# Patient Record
Sex: Female | Born: 1960 | Race: Black or African American | Hispanic: No | Smoking: Never smoker
Health system: Southern US, Community
[De-identification: ages and names within clinical notes are randomized; demographics above are authoritative.]

## PROBLEM LIST (undated history)

## (undated) DIAGNOSIS — I639 Cerebral infarction, unspecified: Secondary | ICD-10-CM

## (undated) DIAGNOSIS — E119 Type 2 diabetes mellitus without complications: Secondary | ICD-10-CM

## (undated) DIAGNOSIS — R4701 Aphasia: Secondary | ICD-10-CM

---

## 2020-04-30 ENCOUNTER — Other Ambulatory Visit: Payer: Self-pay

## 2020-04-30 ENCOUNTER — Encounter (HOSPITAL_COMMUNITY): Payer: Self-pay

## 2020-04-30 ENCOUNTER — Emergency Department (HOSPITAL_COMMUNITY)
Admission: EM | Admit: 2020-04-30 | Discharge: 2020-05-01 | Disposition: A | Discharge status: Skilled Nursing Facility | Payer: Medicare HMO | Attending: Emergency Medicine | Admitting: Emergency Medicine

## 2020-04-30 ENCOUNTER — Emergency Department (HOSPITAL_COMMUNITY): Payer: Medicare HMO

## 2020-04-30 DIAGNOSIS — Y9389 Activity, other specified: Secondary | ICD-10-CM | POA: Diagnosis not present

## 2020-04-30 DIAGNOSIS — W19XXXA Unspecified fall, initial encounter: Secondary | ICD-10-CM | POA: Insufficient documentation

## 2020-04-30 DIAGNOSIS — Y92128 Other place in nursing home as the place of occurrence of the external cause: Secondary | ICD-10-CM | POA: Diagnosis not present

## 2020-04-30 DIAGNOSIS — Y999 Unspecified external cause status: Secondary | ICD-10-CM | POA: Insufficient documentation

## 2020-04-30 DIAGNOSIS — S2231XA Fracture of one rib, right side, initial encounter for closed fracture: Secondary | ICD-10-CM | POA: Diagnosis not present

## 2020-04-30 DIAGNOSIS — S42001A Fracture of unspecified part of right clavicle, initial encounter for closed fracture: Secondary | ICD-10-CM | POA: Diagnosis not present

## 2020-04-30 DIAGNOSIS — E119 Type 2 diabetes mellitus without complications: Secondary | ICD-10-CM | POA: Insufficient documentation

## 2020-04-30 DIAGNOSIS — S299XXA Unspecified injury of thorax, initial encounter: Secondary | ICD-10-CM | POA: Diagnosis present

## 2020-04-30 HISTORY — DX: Type 2 diabetes mellitus without complications: E11.9

## 2020-04-30 HISTORY — DX: Aphasia: R47.01

## 2020-04-30 HISTORY — DX: Cerebral infarction, unspecified: I63.9

## 2020-04-30 MED ORDER — ACETAMINOPHEN 325 MG PO TABS
650.0000 mg | ORAL_TABLET | Freq: Once | ORAL | Status: DC
  Filled 2020-04-30: qty 2

## 2020-04-30 NOTE — ED Provider Notes (Addendum)
[facility] DEPT Provider Note   CSN: [id number] Arrival date & time: 04/30/20  2130     History Chief Complaint  Patient presents with  . Fall    Stacy Brandt is a 59 y.o. female.  The history is provided by the patient.  Fall This is a new problem. The current episode started yesterday. The problem has not changed since onset.Pertinent negatives include no chest pain, no abdominal pain, no headaches and no shortness of breath. Associated symptoms comments: Right clavicle fx on xray yesterday,. Here for possible ortho consult. . Nothing aggravates the symptoms. Nothing relieves the symptoms. She has tried nothing for the symptoms. The treatment provided no relief.       Past Medical History:  Diagnosis Date  . Aphasia   . Diabetes mellitus without complication ([place])   . Stroke [facility])     There are no problems to display for this patient.   History reviewed. No pertinent surgical history.   OB History   No obstetric history on file.     No family history on file.  Social History   Tobacco Use  . Smoking status: Not on file  Substance Use Topics  . Alcohol use: Not on file  . Drug use: Not on file    Home Medications Prior to Admission medications   Not on File    Allergies    Patient has no known allergies.  Review of Systems   Review of Systems  Constitutional: Negative for chills and fever.  HENT: Negative for ear pain and sore throat.   Eyes: Negative for pain and visual disturbance.  Respiratory: Negative for cough and shortness of breath.   Cardiovascular: Negative for chest pain and palpitations.  Gastrointestinal: Negative for abdominal pain and vomiting.  Genitourinary: Negative for dysuria and hematuria.  Musculoskeletal: Positive for arthralgias. Negative for back pain.  Skin: Negative for color change and rash.  Neurological: Negative for seizures, syncope and headaches.  All other systems reviewed and  are negative.   Physical Exam Updated Vital Signs BP 102/63 (BP Location: Left Arm)   Pulse 98   Temp 99 F (37.2 C) (Oral)   Resp 18   Ht 5\' 4"  (1.626 m)   Wt 50.8 kg   SpO2 99%   BMI 19.22 kg/m   Physical Exam Vitals and nursing note reviewed.  Constitutional:      General: She is not in acute distress.    Appearance: She is well-developed.  HENT:     Head: Normocephalic and atraumatic.     Mouth/Throat:     Mouth: Mucous membranes are moist.  Eyes:     Extraocular Movements: Extraocular movements intact.     Conjunctiva/sclera: Conjunctivae normal.     Pupils: Pupils are equal, round, and reactive to light.  Cardiovascular:     Rate and Rhythm: Normal rate and regular rhythm.     Pulses: Normal pulses.     Heart sounds: No murmur.  Pulmonary:     Effort: Pulmonary effort is normal. No respiratory distress.     Breath sounds: Normal breath sounds.  Abdominal:     Palpations: Abdomen is soft.     Tenderness: There is no abdominal tenderness.  Musculoskeletal:        General: Tenderness (TTP right clavicle) present. No swelling. Normal range of motion.     Cervical back: Normal range of motion and neck supple. No tenderness.  Skin:    General: Skin is warm and  dry.     Capillary Refill: Capillary refill takes less than 2 seconds.  Neurological:     General: No focal deficit present.     Mental Status: She is alert and oriented to person, place, and time.     Cranial Nerves: No cranial nerve deficit.     Sensory: No sensory deficit.     Coordination: Coordination normal.     Comments: 5+ out of 5 strength in the right upper extremity, normal sensation     ED Results / Procedures / Treatments   Labs (all labs ordered are listed, but only abnormal results are displayed) Labs Reviewed - No data to display  EKG None  Radiology DG Chest 2 View  Result Date: 04/30/2020 CLINICAL DATA:  Pain, fall EXAM: CHEST - 2 VIEW COMPARISON:  None. FINDINGS: Multiple skin  fold artifact over the left apex. No acute consolidation or pleural effusion. Cardiomediastinal silhouette within normal limits. Aortic atherosclerosis. Old appearing right rib fractures. Ununited fracture deformity of the mid right clavicle, not much appreciable overlying soft tissue swelling suggesting remote injury. IMPRESSION: No active cardiopulmonary disease. Electronically Signed   By: Donavan Foil M.D.   On: 04/30/2020 22:09   DG Clavicle Right  Result Date: 04/30/2020 CLINICAL DATA:  Fall in nursing home EXAM: RIGHT CLAVICLE - 2+ VIEWS COMPARISON:  None. FINDINGS: Suspected deformity of the middle third clavicle is likely chronic though some possible cortical step-off on the under surface may suggest an acute on chronic injury. There is a fracture of the posterior right second rib. No other acute osseous abnormality. No visible apical pneumothorax or other traumatic findings in the chest. IMPRESSION: 1. Fracture of the posterior right second rib. 2. Remote appearing deformity of the middle third clavicle with some questionable cortical step-off which could reflect an acute on chronic injury. Could correlate for point tenderness. Electronically Signed   By: Lovena Le M.D.   On: 04/30/2020 22:08    Procedures Procedures (including critical care time)  Medications Ordered in ED Medications  acetaminophen (TYLENOL) tablet 650 mg (has no administration in time range)    ED Course  I have reviewed the triage vital signs and the nursing notes.  Pertinent labs & imaging results that were available during my care of the patient were reviewed by me and considered in my medical decision making (see chart for details).    MDM Rules/Calculators/A&P                      Stacy Brandt is a 59 year old female with history of stroke, diabetes who presents to the ED with right clavicle pain.  Was diagnosed with fracture yesterday.  Was sent for orthopedic consultation from her skilled nursing  facility.  Patient with normal vitals.  No fever.  Pain in the right clavicular area.  Otherwise no pain.  Today shows fracture of the posterior right second rib and likely fracture of the right clavicle.  there is no pneumothorax.  Patient is overall well-appearing.  Neurovascularly intact.  Placed in a sling for comfort.  These are nonoperative injuries.  Given number to follow-up with orthopedics outpatient which is appropriate.  Discharged in good condition.  Given Tylenol.  Recommend Tylenol Motrin for pain.  Patient overall comfortable not in much pain on exam.  This chart was dictated using voice recognition software.  Despite best efforts to proofread,  errors can occur which can change the documentation meaning.   Final Clinical Impression(s) / ED Diagnoses  Final diagnoses:  Closed displaced fracture of right clavicle, unspecified part of clavicle, initial encounter    Rx / DC Orders ED Discharge Orders    None       Lennice Sites, DO 04/30/20 2217    Lennice Sites, DO 04/30/20 2218

## 2020-04-30 NOTE — Discharge Instructions (Signed)
You likely have acute on chronic clavicle fracture, second rib fracture.  These are likely nonoperative.  Wear sling for comfort.  Tylenol Motrin for pain as needed.  I have given you the number to follow-up with orthopedics.

## 2020-04-30 NOTE — ED Triage Notes (Signed)
Fall yesterday from [facility]. XR resulted today and showed a right mid clavicle fx per facility. Sent for ortho consult.

## 2020-05-01 ENCOUNTER — Encounter (HOSPITAL_COMMUNITY): Payer: Self-pay | Admitting: Emergency Medicine

## 2020-05-01 DIAGNOSIS — S42001A Fracture of unspecified part of right clavicle, initial encounter for closed fracture: Secondary | ICD-10-CM | POA: Diagnosis not present

## 2020-05-01 NOTE — Progress Notes (Signed)
Orthopedic Tech Progress Note Patient Details:  Stacy Brandt Dec 16, 1961 [id number]  Ortho Devices Type of Ortho Device: Sling immobilizer Ortho Device/Splint Location: Charging for ER supplies       [name] 05/01/2020, 7:27 AM

## 2020-07-27 ENCOUNTER — Other Ambulatory Visit: Payer: Self-pay

## 2020-07-27 ENCOUNTER — Encounter (HOSPITAL_COMMUNITY): Payer: Self-pay

## 2020-07-27 ENCOUNTER — Emergency Department (HOSPITAL_COMMUNITY)
Admission: EM | Admit: 2020-07-27 | Discharge: 2020-07-28 | Disposition: A | Discharge status: Home / Self Care | Payer: Medicare HMO | Attending: Emergency Medicine | Admitting: Emergency Medicine

## 2020-07-27 ENCOUNTER — Emergency Department (HOSPITAL_COMMUNITY): Payer: Medicare HMO

## 2020-07-27 DIAGNOSIS — R1084 Generalized abdominal pain: Secondary | ICD-10-CM | POA: Diagnosis not present

## 2020-07-27 DIAGNOSIS — Z794 Long term (current) use of insulin: Secondary | ICD-10-CM | POA: Diagnosis not present

## 2020-07-27 DIAGNOSIS — Z7982 Long term (current) use of aspirin: Secondary | ICD-10-CM | POA: Diagnosis not present

## 2020-07-27 DIAGNOSIS — Z20822 Contact with and (suspected) exposure to covid-19: Secondary | ICD-10-CM | POA: Insufficient documentation

## 2020-07-27 DIAGNOSIS — Z79899 Other long term (current) drug therapy: Secondary | ICD-10-CM | POA: Insufficient documentation

## 2020-07-27 DIAGNOSIS — E119 Type 2 diabetes mellitus without complications: Secondary | ICD-10-CM | POA: Insufficient documentation

## 2020-07-27 DIAGNOSIS — Z8673 Personal history of transient ischemic attack (TIA), and cerebral infarction without residual deficits: Secondary | ICD-10-CM | POA: Insufficient documentation

## 2020-07-27 DIAGNOSIS — N390 Urinary tract infection, site not specified: Secondary | ICD-10-CM

## 2020-07-27 DIAGNOSIS — R112 Nausea with vomiting, unspecified: Secondary | ICD-10-CM | POA: Diagnosis not present

## 2020-07-27 DIAGNOSIS — K5900 Constipation, unspecified: Secondary | ICD-10-CM

## 2020-07-27 DIAGNOSIS — N7011 Chronic salpingitis: Secondary | ICD-10-CM

## 2020-07-27 DIAGNOSIS — R339 Retention of urine, unspecified: Secondary | ICD-10-CM

## 2020-07-27 LAB — COMPREHENSIVE METABOLIC PANEL
ALT: 14 U/L (ref 0–44)
AST: 30 U/L (ref 15–41)
Albumin: 3.7 g/dL (ref 3.5–5.0)
Alkaline Phosphatase: 52 U/L (ref 38–126)
Anion gap: 12 (ref 5–15)
BUN: 34 mg/dL — ABNORMAL HIGH (ref 6–20)
CO2: 20 mmol/L — ABNORMAL LOW (ref 22–32)
Calcium: 9 mg/dL (ref 8.9–10.3)
Chloride: 108 mmol/L (ref 98–111)
Creatinine, Ser: 1.05 mg/dL — ABNORMAL HIGH (ref 0.44–1.00)
GFR calc Af Amer: >60 mL/min (ref >60)
GFR calc non Af Amer: 59 mL/min — ABNORMAL LOW (ref >60)
Glucose, Bld: 315 mg/dL — ABNORMAL HIGH (ref 70–99)
Potassium: 5.8 mmol/L — ABNORMAL HIGH (ref 3.5–5.1)
Sodium: 140 mmol/L (ref 135–145)
Total Bilirubin: 1.3 mg/dL — ABNORMAL HIGH (ref 0.3–1.2)
Total Protein: 7.4 g/dL (ref 6.5–8.1)

## 2020-07-27 LAB — CBC WITH DIFFERENTIAL/PLATELET
Abs Immature Granulocytes: 0.04 10*3/uL (ref 0.00–0.07)
Basophils Absolute: 0 10*3/uL (ref 0.0–0.1)
Basophils Relative: 0 %
Eosinophils Absolute: 0 10*3/uL (ref 0.0–0.5)
Eosinophils Relative: 0 %
HCT: 32.5 % — ABNORMAL LOW (ref 36.0–46.0)
Hemoglobin: 9.9 g/dL — ABNORMAL LOW (ref 12.0–15.0)
Immature Granulocytes: 0 %
Lymphocytes Relative: 12 %
Lymphs Abs: 1.3 10*3/uL (ref 0.7–4.0)
MCH: 28.2 pg (ref 26.0–34.0)
MCHC: 30.5 g/dL (ref 30.0–36.0)
MCV: 92.6 fL (ref 80.0–100.0)
Monocytes Absolute: 0.3 10*3/uL (ref 0.1–1.0)
Monocytes Relative: 3 %
Neutro Abs: 9.3 10*3/uL — ABNORMAL HIGH (ref 1.7–7.7)
Neutrophils Relative %: 85 %
Platelets: 212 10*3/uL (ref 150–400)
RBC: 3.51 MIL/uL — ABNORMAL LOW (ref 3.87–5.11)
RDW: 13.4 % (ref 11.5–15.5)
WBC: 11 10*3/uL — ABNORMAL HIGH (ref 4.0–10.5)
nRBC: 0 % (ref 0.0–0.2)

## 2020-07-27 LAB — SARS CORONAVIRUS 2 BY RT PCR (HOSPITAL ORDER, PERFORMED IN ~~LOC~~ HOSPITAL LAB): SARS Coronavirus 2: NEGATIVE

## 2020-07-27 LAB — LIPASE, BLOOD: Lipase: 30 U/L (ref 11–51)

## 2020-07-27 MED ORDER — MORPHINE SULFATE (PF) 4 MG/ML IV SOLN
4.0000 mg | Freq: Once | INTRAVENOUS | Status: AC
  Administered 2020-07-27: 4 mg via INTRAVENOUS
  Filled 2020-07-27: qty 1

## 2020-07-27 MED ORDER — INSULIN ASPART 100 UNIT/ML IV SOLN
10.0000 [IU] | Freq: Once | INTRAVENOUS | Status: AC
  Administered 2020-07-27: 10 [IU] via INTRAVENOUS
  Filled 2020-07-27: qty 0.1

## 2020-07-27 MED ORDER — ALUM & MAG HYDROXIDE-SIMETH 200-200-20 MG/5ML PO SUSP
30.0000 mL | Freq: Once | ORAL | Status: AC
  Administered 2020-07-27: 30 mL via ORAL
  Filled 2020-07-27: qty 30

## 2020-07-27 MED ORDER — PROCHLORPERAZINE EDISYLATE 10 MG/2ML IJ SOLN
10.0000 mg | Freq: Once | INTRAMUSCULAR | Status: AC
  Administered 2020-07-27: 10 mg via INTRAVENOUS
  Filled 2020-07-27: qty 2

## 2020-07-27 MED ORDER — SODIUM CHLORIDE 0.9 % IV BOLUS
1000.0000 mL | Freq: Once | INTRAVENOUS | Status: AC
  Administered 2020-07-27: 1000 mL via INTRAVENOUS

## 2020-07-27 MED ORDER — IOHEXOL 300 MG/ML  SOLN
100.0000 mL | Freq: Once | INTRAMUSCULAR | Status: AC | PRN
  Administered 2020-07-27: 100 mL via INTRAVENOUS

## 2020-07-27 MED ORDER — DIPHENHYDRAMINE HCL 50 MG/ML IJ SOLN
25.0000 mg | Freq: Once | INTRAMUSCULAR | Status: AC
  Administered 2020-07-27: 25 mg via INTRAVENOUS
  Filled 2020-07-27: qty 1

## 2020-07-27 MED ORDER — SODIUM CHLORIDE (PF) 0.9 % IJ SOLN
INTRAMUSCULAR | Status: AC
  Filled 2020-07-27: qty 50

## 2020-07-27 NOTE — ED Triage Notes (Signed)
Patient arrived via [facility] with complaints of abdominal pain, NV, fever, malaise. Patient has been vaccinated and there is a Covid-19 positive patient in her facility. 4mg  zofran given en route. PO tylenol and zofran given at facility at 1600

## 2020-07-27 NOTE — ED Notes (Signed)
Recollect light green tube sent to the lab
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
## 2020-07-27 NOTE — ED Notes (Signed)
Patient tolerating fluids with no difficulty

## 2020-07-28 DIAGNOSIS — R1084 Generalized abdominal pain: Secondary | ICD-10-CM | POA: Diagnosis not present

## 2020-07-28 LAB — I-STAT CHEM 8, ED
BUN: 30 mg/dL — ABNORMAL HIGH (ref 6–20)
Calcium, Ion: 1.14 mmol/L — ABNORMAL LOW (ref 1.15–1.40)
Chloride: 112 mmol/L — ABNORMAL HIGH (ref 98–111)
Creatinine, Ser: 0.6 mg/dL (ref 0.44–1.00)
Glucose, Bld: 194 mg/dL — ABNORMAL HIGH (ref 70–99)
HCT: 24 % — ABNORMAL LOW (ref 36.0–46.0)
Hemoglobin: 8.2 g/dL — ABNORMAL LOW (ref 12.0–15.0)
Potassium: 3.4 mmol/L — ABNORMAL LOW (ref 3.5–5.1)
Sodium: 146 mmol/L — ABNORMAL HIGH (ref 135–145)
TCO2: 20 mmol/L — ABNORMAL LOW (ref 22–32)

## 2020-07-28 LAB — URINALYSIS, ROUTINE W REFLEX MICROSCOPIC
Bacteria, UA: NONE SEEN
Bilirubin Urine: NEGATIVE
Glucose, UA: 150 mg/dL — AB
Ketones, ur: NEGATIVE mg/dL
Nitrite: NEGATIVE
Protein, ur: 100 mg/dL — AB
Specific Gravity, Urine: 1.021 (ref 1.005–1.030)
WBC, UA: >50 WBC/hpf — ABNORMAL HIGH (ref 0–5)
pH: 5 (ref 5.0–8.0)

## 2020-07-28 MED ORDER — CEPHALEXIN 500 MG PO CAPS: 500.0000 mg | ORAL_CAPSULE | Freq: Three times a day (TID) | ORAL | 0 refills | Status: AC

## 2020-07-28 MED ORDER — ONDANSETRON 4 MG PO TBDP
ORAL_TABLET | ORAL | 0 refills | Status: DC
Start: 2020-07-28 — End: 2020-08-10

## 2020-07-28 MED ORDER — CEPHALEXIN 500 MG PO CAPS
500.0000 mg | ORAL_CAPSULE | Freq: Once | ORAL | Status: AC
  Administered 2020-07-28: 500 mg via ORAL
  Filled 2020-07-28: qty 1

## 2020-07-28 MED ORDER — FLUCONAZOLE 150 MG PO TABS
150.0000 mg | ORAL_TABLET | Freq: Once | ORAL | Status: AC
  Administered 2020-07-28: 150 mg via ORAL
  Filled 2020-07-28: qty 1

## 2020-07-28 MED ORDER — POLYETHYLENE GLYCOL 3350 17 G PO PACK: 17.0000 g | PACK | Freq: Every day | ORAL | 0 refills | Status: AC

## 2020-07-28 NOTE — ED Notes (Signed)
[facility] called for transport.  

## 2020-07-28 NOTE — Discharge Instructions (Addendum)
You are in urinary retention.  Please keep catheter in place until you see urology in 2 to 3 days for removal.  You may have a urine infection as well so please take Keflex 500 mg 3 times daily for 5 days.  You are constipated as well so take MiraLAX daily for a week.  You have hydrosalpinx on your CT so please follow-up with women's outpatient clinic for ultrasound  Take Zofran for nausea and vomiting.  See your doctor for follow-up.  Return to ER if you have severe abdominal pain, vomiting, fever.

## 2020-07-28 NOTE — ED Provider Notes (Signed)
  Physical Exam  BP (!) 124/63   Pulse (!) 106   Temp 98.5 F (36.9 C) (Oral)   Resp 13   SpO2 94%   Physical Exam  ED Course/Procedures     Procedures  MDM  Care assumed at 11 pm.  Patient is presenting here for abdominal pain and vomiting.  Signout pending CT abdomen pelvis and Foley catheter since patient appears to be in retention.  12:40 AM Patient's catheter drained about 1 L.  UA showed leukocytes and some yeast.  Will give a course of Keflex and a dose of Diflucan.  Of note, patient's initial potassium is 5.8 but repeat is 3.4 and I think the initial 1 is likely hemolyzed.  Her CT showed constipation.  She also has some hydrosalpinx that can be evaluated with outpatient [place].      Drenda Freeze, MD 07/28/20 [id number]

## 2020-07-28 NOTE — ED Notes (Signed)
Attempted to call report to [facility] with no answer

## 2020-08-09 ENCOUNTER — Other Ambulatory Visit: Payer: Self-pay

## 2020-08-09 ENCOUNTER — Emergency Department (HOSPITAL_COMMUNITY)
Admission: EM | Admit: 2020-08-09 | Discharge: 2020-08-10 | Disposition: A | Discharge status: Home / Self Care | Payer: Medicare HMO | Attending: Emergency Medicine | Admitting: Emergency Medicine

## 2020-08-09 DIAGNOSIS — Z20822 Contact with and (suspected) exposure to covid-19: Secondary | ICD-10-CM | POA: Diagnosis not present

## 2020-08-09 DIAGNOSIS — E1165 Type 2 diabetes mellitus with hyperglycemia: Secondary | ICD-10-CM | POA: Diagnosis not present

## 2020-08-09 DIAGNOSIS — N7011 Chronic salpingitis: Secondary | ICD-10-CM | POA: Diagnosis not present

## 2020-08-09 DIAGNOSIS — Z794 Long term (current) use of insulin: Secondary | ICD-10-CM | POA: Insufficient documentation

## 2020-08-09 DIAGNOSIS — R112 Nausea with vomiting, unspecified: Secondary | ICD-10-CM | POA: Insufficient documentation

## 2020-08-09 DIAGNOSIS — Z7982 Long term (current) use of aspirin: Secondary | ICD-10-CM | POA: Insufficient documentation

## 2020-08-09 DIAGNOSIS — Z8673 Personal history of transient ischemic attack (TIA), and cerebral infarction without residual deficits: Secondary | ICD-10-CM | POA: Insufficient documentation

## 2020-08-09 DIAGNOSIS — R739 Hyperglycemia, unspecified: Secondary | ICD-10-CM | POA: Diagnosis present

## 2020-08-09 LAB — URINALYSIS, ROUTINE W REFLEX MICROSCOPIC
Bacteria, UA: NONE SEEN
Bilirubin Urine: NEGATIVE
Glucose, UA: >=500 mg/dL — AB
Ketones, ur: 5 mg/dL — AB
Leukocytes,Ua: NEGATIVE
Nitrite: NEGATIVE
Protein, ur: >=300 mg/dL — AB
Specific Gravity, Urine: 1.017 (ref 1.005–1.030)
pH: 5 (ref 5.0–8.0)

## 2020-08-09 LAB — COMPREHENSIVE METABOLIC PANEL
ALT: 25 U/L (ref 0–44)
AST: 14 U/L — ABNORMAL LOW (ref 15–41)
Albumin: 4.2 g/dL (ref 3.5–5.0)
Alkaline Phosphatase: 60 U/L (ref 38–126)
Anion gap: 15 (ref 5–15)
BUN: 35 mg/dL — ABNORMAL HIGH (ref 6–20)
CO2: 23 mmol/L (ref 22–32)
Calcium: 10.4 mg/dL — ABNORMAL HIGH (ref 8.9–10.3)
Chloride: 101 mmol/L (ref 98–111)
Creatinine, Ser: 1.05 mg/dL — ABNORMAL HIGH (ref 0.44–1.00)
GFR calc Af Amer: >60 mL/min (ref >60)
GFR calc non Af Amer: 59 mL/min — ABNORMAL LOW (ref >60)
Glucose, Bld: 416 mg/dL — ABNORMAL HIGH (ref 70–99)
Potassium: 4.2 mmol/L (ref 3.5–5.1)
Sodium: 139 mmol/L (ref 135–145)
Total Bilirubin: 0.6 mg/dL (ref 0.3–1.2)
Total Protein: 8.1 g/dL (ref 6.5–8.1)

## 2020-08-09 LAB — CBC WITH DIFFERENTIAL/PLATELET
Abs Immature Granulocytes: 0.06 10*3/uL (ref 0.00–0.07)
Basophils Absolute: 0 10*3/uL (ref 0.0–0.1)
Basophils Relative: 0 %
Eosinophils Absolute: 0 10*3/uL (ref 0.0–0.5)
Eosinophils Relative: 0 %
HCT: 36.1 % (ref 36.0–46.0)
Hemoglobin: 11.5 g/dL — ABNORMAL LOW (ref 12.0–15.0)
Immature Granulocytes: 0 %
Lymphocytes Relative: 11 %
Lymphs Abs: 1.5 10*3/uL (ref 0.7–4.0)
MCH: 28.3 pg (ref 26.0–34.0)
MCHC: 31.9 g/dL (ref 30.0–36.0)
MCV: 88.9 fL (ref 80.0–100.0)
Monocytes Absolute: 0.4 10*3/uL (ref 0.1–1.0)
Monocytes Relative: 3 %
Neutro Abs: 11.8 10*3/uL — ABNORMAL HIGH (ref 1.7–7.7)
Neutrophils Relative %: 86 %
Platelets: 315 10*3/uL (ref 150–400)
RBC: 4.06 MIL/uL (ref 3.87–5.11)
RDW: 12.5 % (ref 11.5–15.5)
WBC: 13.8 10*3/uL — ABNORMAL HIGH (ref 4.0–10.5)
nRBC: 0 % (ref 0.0–0.2)

## 2020-08-09 LAB — CBG MONITORING, ED: Glucose-Capillary: 395 mg/dL — ABNORMAL HIGH (ref 70–99)

## 2020-08-09 LAB — LIPASE, BLOOD: Lipase: 30 U/L (ref 11–51)

## 2020-08-09 MED ORDER — SODIUM CHLORIDE 0.9 % IV BOLUS
1000.0000 mL | Freq: Once | INTRAVENOUS | Status: AC
  Administered 2020-08-09: 1000 mL via INTRAVENOUS

## 2020-08-09 MED ORDER — FENTANYL CITRATE (PF) 100 MCG/2ML IJ SOLN
50.0000 ug | Freq: Once | INTRAMUSCULAR | Status: AC
  Administered 2020-08-09: 50 ug via INTRAVENOUS
  Filled 2020-08-09: qty 2

## 2020-08-09 MED ORDER — ONDANSETRON HCL 4 MG/2ML IJ SOLN
4.0000 mg | Freq: Once | INTRAMUSCULAR | Status: AC
  Administered 2020-08-09: 4 mg via INTRAVENOUS
  Filled 2020-08-09: qty 2

## 2020-08-09 NOTE — ED Provider Notes (Signed)
[facility] DEPT Provider Note   CSN: [id number] Arrival date & time: 08/09/20  2142     History Chief Complaint  Patient presents with   Hyperglycemia    Stacy Brandt is a 59 y.o. female with a h/o of DM Type II, CVA presents to the emergency department with a chief complaint of vomiting.  The patient reports nausea and vomiting, onset today.  She is unsure how many episodes of nonbloody, nonbilious vomiting that she had prior to arrival.  She is endorsing diffuse, associated abdominal pain.  States the pain began earlier today.  Pain has been constant.  No known aggravating or alleviating factors.  No diarrhea or constipation, fever, chills, chest pain, shortness of breath, cough, dysuria, hematuria, or vaginal discharge.  She has a Foley catheter in place from an ER visit 2 weeks ago.  EMS reports patient's CBG was 457 in route.  She was given fluids.  Nursing facility states that patient was given 20 units of Levemir tonight along with her other evening medications.  Spoke with [facility] staff; however, patient's nurse was unavailable.  Staff reports that patient called EMS without consulting them and was not evaluated by in-house staff prior to coming to the ER.   The history is provided by the patient. No language interpreter was used.       Past Medical History:  Diagnosis Date   Aphasia    Diabetes mellitus without complication ([place])    Stroke ([place])     There are no problems to display for this patient.   No past surgical history on file.   OB History   No obstetric history on file.     No family history on file.  Social History   Tobacco Use   Smoking status: Never Smoker   Smokeless tobacco: Never Used  Vaping Use   Vaping Use: Never used  Substance Use Topics   Alcohol use: Not Currently   Drug use: Not Currently    Home Medications Prior to Admission medications   Medication Sig Start Date End Date  Taking? Authorizing Provider  acetaminophen (TYLENOL) 325 MG tablet Take 650 mg by mouth every 6 (six) hours as needed (pain).    [provider]  albuterol (VENTOLIN HFA) 108 (90 Base) MCG/ACT inhaler Inhale 2 puffs into the lungs every 4 (four) hours as needed for wheezing or shortness of breath.    [provider]  amLODipine (NORVASC) 10 MG tablet Take 10 mg by mouth daily.    [provider]  aspirin EC 81 MG tablet Take 81 mg by mouth daily. Swallow whole.    [provider]  cephALEXin (KEFLEX) 500 MG capsule Take 1 capsule (500 mg total) by mouth 3 (three) times daily. 07/28/20   Drenda Freeze, MD  cholecalciferol (VITAMIN D3) 25 MCG (1000 UNIT) tablet Take 2,000 Units by mouth daily.    [provider]  dicyclomine (BENTYL) 20 MG tablet Take 20 mg by mouth in the morning and at bedtime.    [provider]  diphenhydrAMINE (BENADRYL) 25 MG tablet Take 25 mg by mouth 2 (two) times daily as needed (allergic reaction).    [provider]  FLUoxetine (PROZAC) 20 MG tablet Take 20 mg by mouth daily.    [provider]  gabapentin (NEURONTIN) 100 MG capsule Take 100 mg by mouth 3 (three) times daily.    [provider]  hydrochlorothiazide (HYDRODIURIL) 12.5 MG tablet Take 12.5 mg by mouth  daily.    [provider]  insulin aspart (NOVOLOG) 100 UNIT/ML injection Inject 3-15 Units into the skin See admin instructions. Inject 3-15 units subcutaneously three times daily before meals per sliding scale: CBG 101-150 3 units, 151-200 4 units, 201-250 7 units, 251-300 9 units, 301-350 12 units, >350 15 units, call MD for CBG >400 or <60    [provider]  insulin detemir (LEVEMIR FLEXTOUCH) 100 UNIT/ML FlexPen Inject 20 Units into the skin 2 (two) times daily.    [provider]  ketoconazole (NIZORAL) 2 % shampoo Apply 1 application topically every Wednesday.    [provider]   lisinopril (ZESTRIL) 40 MG tablet Take 20 mg by mouth daily.    [provider]  omeprazole (PRILOSEC) 20 MG capsule Take 20 mg by mouth 2 (two) times daily.    [provider]  ondansetron (ZOFRAN ODT) 4 MG disintegrating tablet Take 1 tablet (4 mg total) by mouth every 8 (eight) hours as needed. 08/10/20   Aneliese Beaudry A, PA-C  ondansetron (ZOFRAN) 4 MG tablet Take 4 mg by mouth every 8 (eight) hours as needed for nausea or vomiting.    [provider]  polyethylene glycol (MIRALAX / GLYCOLAX) 17 g packet Take 17 g by mouth daily. 07/28/20   Drenda Freeze, MD  rosuvastatin (CRESTOR) 40 MG tablet Take 40 mg by mouth daily at 6 PM.    [provider]  triamcinolone cream (KENALOG) 0.1 % Apply 1 application topically 2 (two) times daily as needed (allergic reaction).    [provider]    Allergies    Patient has no known allergies.  Review of Systems   Review of Systems  Constitutional: Negative for activity change, chills and fever.  HENT: Negative for congestion and sore throat.   Respiratory: Negative for cough and shortness of breath.   Cardiovascular: Negative for chest pain.  Gastrointestinal: Positive for abdominal pain, nausea and vomiting. Negative for constipation and diarrhea.  Genitourinary: Negative for dysuria, frequency, hematuria, urgency, vaginal bleeding, vaginal discharge and vaginal pain.  Musculoskeletal: Negative for arthralgias, back pain, myalgias and neck stiffness.  Skin: Negative for rash.  Allergic/Immunologic: Negative for immunocompromised state.  Neurological: Negative for seizures, syncope, weakness and headaches.  Psychiatric/Behavioral: Negative for confusion.    Physical Exam Updated Vital Signs BP 124/70    Pulse 97    Temp 98.4 F (36.9 C) (Oral)    Resp 18    SpO2 96%   Physical Exam Vitals and nursing note reviewed.  Constitutional:      General: She is not in acute distress.    Comments:  Nontoxic-appearing.  No acute distress. Foley catheter in place.  Light yellow urine noted in bag.  HENT:     Head: Normocephalic.  Eyes:     Conjunctiva/sclera: Conjunctivae normal.  Cardiovascular:     Rate and Rhythm: Normal rate and regular rhythm.     Heart sounds: No murmur heard.  No friction rub. No gallop.   Pulmonary:     Effort: Pulmonary effort is normal. No respiratory distress.     Breath sounds: No stridor. No wheezing, rhonchi or rales.  Chest:     Chest wall: No tenderness.  Abdominal:     General: There is no distension.     Palpations: Abdomen is soft. There is no mass.     Tenderness: There is no abdominal tenderness. There is no right CVA tenderness, left CVA tenderness, guarding or rebound.  Hernia: No hernia is present.     Comments: Multiple scars noted from previous abdominal surgeries.  No wound dehiscence.  Abdomen is soft, nontender, nondistended.  Normoactive bowel sounds.  No tenderness over McBurney's point.  Negative Murphy sign.  No CVA tenderness bilaterally.  Musculoskeletal:     Cervical back: Neck supple.     Right lower leg: No edema.     Left lower leg: No edema.  Skin:    General: Skin is warm.     Findings: No rash.  Neurological:     Mental Status: She is alert.  Psychiatric:        Behavior: Behavior normal.     ED Results / Procedures / Treatments   Labs (all labs ordered are listed, but only abnormal results are displayed) Labs Reviewed  CBC WITH DIFFERENTIAL/PLATELET - Abnormal; Notable for the following components:      Result Value   WBC 13.8 (*)    Hemoglobin 11.5 (*)    Neutro Abs 11.8 (*)    All other components within normal limits  COMPREHENSIVE METABOLIC PANEL - Abnormal; Notable for the following components:   Glucose, Bld 416 (*)    BUN 35 (*)    Creatinine, Ser 1.05 (*)    Calcium 10.4 (*)    AST 14 (*)    GFR calc non Af Amer 59 (*)    All other components within normal limits  URINALYSIS, ROUTINE W  REFLEX MICROSCOPIC - Abnormal; Notable for the following components:   Glucose, UA >=500 (*)    Hgb urine dipstick SMALL (*)    Ketones, ur 5 (*)    Protein, ur >=300 (*)    All other components within normal limits  CBG MONITORING, ED - Abnormal; Notable for the following components:   Glucose-Capillary 395 (*)    All other components within normal limits  CBG MONITORING, ED - Abnormal; Notable for the following components:   Glucose-Capillary 334 (*)    All other components within normal limits  CBG MONITORING, ED - Abnormal; Notable for the following components:   Glucose-Capillary 301 (*)    All other components within normal limits  SARS CORONAVIRUS 2 BY RT PCR (HOSPITAL ORDER, [facility])  LIPASE, BLOOD    EKG None  Radiology No results found.  Procedures Procedures (including critical care time)  Medications Ordered in ED Medications  fentaNYL (SUBLIMAZE) injection 50 mcg (50 mcg Intravenous Given 08/09/20 2303)  ondansetron (ZOFRAN) injection 4 mg (4 mg Intravenous Given 08/09/20 2300)  sodium chloride 0.9 % bolus 1,000 mL (0 mLs Intravenous Stopped 08/10/20 0251)  insulin aspart (novoLOG) injection 10 Units (10 Units Subcutaneous Given 08/10/20 0300)    ED Course  I have reviewed the triage vital signs and the nursing notes.  Pertinent labs & imaging results that were available during my care of the patient were reviewed by me and considered in my medical decision making (see chart for details).    MDM Rules/Calculators/A&P                          59 year old female with a h/o of DM Type II, CVA who presents to the emergency department by EMS from [facility] for vomiting.  Staff reports that the patient called EMS on her own before she was evaluated by in-house staff.  She presented with 1 day of vomiting and diffuse abdominal pain.  No vomiting noted since patient arrived in  the ER.  She was mildly tachycardic on arrival.  Afebrile.   Hypertensive on arrival, but normotensive by the time of discharge.  Labs notable for glucose of 416 on arrival with normal anion gap and bicarb.  Creatinine is 1, up from 0.62 weeks ago.  Although she appears to have a mild leukocytosis of 13, I suspect this is actually hemoconcentration from her previous CBC given the previous H&H were 8.2/24 --> 11.5/36 today.    She was given IV fluids, insulin, and Zofran.  She is successfully fluid challenge.  UA with worsening glucosuria.  She does have 3+ proteinuria.  However, she needs to follow-up with urology for Foley catheter removal.  There is no evidence of UTI.  Glucose has been downtrending since treatment and tachycardia resolved.  Abdomen is soft and benign.  Her previous CT scan from 2 weeks ago was reviewed.  She may have hydrosalpinx, and I will give her a referral to OB/GYN.  There was some concern for constipation.  However, today her exam is not concerning for bowel obstruction.  Patient does appear to have some concerns about returning to [facility], but she can follow-up with social work if she needs alternative placement.  I have a low suspicion for cholecystitis, appendicitis, diverticulitis, bowel obstruction, PID, pyelonephritis, or mesenteric ischemia.  She is hemodynamically stable and in no acute distress.  Safe for discharge home from outpatient follow-up as indicated.  Final Clinical Impression(s) / ED Diagnoses Final diagnoses:  Non-intractable vomiting with nausea, unspecified vomiting type  Hyperglycemia  Hydrosalpinx    Rx / DC Orders ED Discharge Orders         Ordered    ondansetron (ZOFRAN ODT) 4 MG disintegrating tablet  Every 8 hours PRN     Discontinue  Reprint     08/10/20 0308           Tumeka Chimenti A, PA-C 35/46/56 [id number]    Delora Fuel, MD 51/70/01 [id number]

## 2020-08-09 NOTE — ED Triage Notes (Signed)
Pt from [facility], pt called 911 stated she report abd pain with N/V during transport CBG 457 fluid started on arrival. Pt was given evening meds prior to going to ED routine dose levemir 20 units with other evening meds see MAR

## 2020-08-10 DIAGNOSIS — R112 Nausea with vomiting, unspecified: Secondary | ICD-10-CM | POA: Diagnosis not present

## 2020-08-10 LAB — SARS CORONAVIRUS 2 BY RT PCR (HOSPITAL ORDER, PERFORMED IN ~~LOC~~ HOSPITAL LAB): SARS Coronavirus 2: NEGATIVE

## 2020-08-10 LAB — CBG MONITORING, ED
Glucose-Capillary: 334 mg/dL — ABNORMAL HIGH (ref 70–99)
Glucose-Capillary: 301 mg/dL — ABNORMAL HIGH (ref 70–99)

## 2020-08-10 MED ORDER — INSULIN ASPART 100 UNIT/ML ~~LOC~~ SOLN
10.0000 [IU] | Freq: Once | SUBCUTANEOUS | Status: AC
  Administered 2020-08-10: 10 [IU] via SUBCUTANEOUS
  Filled 2020-08-10: qty 0.1

## 2020-08-10 MED ORDER — ONDANSETRON 4 MG PO TBDP: 4.0000 mg | ORAL_TABLET | Freq: Three times a day (TID) | ORAL | 0 refills | Status: AC | PRN

## 2020-08-10 NOTE — ED Notes (Signed)
[facility] called for transport. report called to Facility and spoke to Ms. Cindy Rich,RN

## 2020-08-10 NOTE — ED Notes (Signed)
Pt is tolerating ginger ale with no nausea.

## 2020-08-10 NOTE — Discharge Instructions (Signed)
Thank you for allowing me to care for you today in the Emergency Department.   Your blood sugar was high today.  This has been treated with fluids and insulin with improvement.  Your nausea and vomiting were treated in the ER with medication.  Let 1 tablet of Zofran dissolve in your tongue every 8 hours as needed for nausea and vomiting.  Please follow-up with primary care if you continue to have intermittent episodes of vomiting.  Your work-up was otherwise reassuring.  Please follow-up with urology regarding your Foley catheter.  Your last CT scan was concerning for hydrosalpinx.  You can follow-up with OB/GYN for this.  Return to the emergency department if you develop uncontrollable vomiting despite taking Zofran, uncontrollable abdominal pain, abdominal pain and fevers, if you stop producing urine and and become dehydrated, or develop other new, concerning symptoms.

## 2020-12-05 DIAGNOSIS — Z955 Presence of coronary angioplasty implant and graft: Secondary | ICD-10-CM | POA: Diagnosis not present

## 2020-12-05 DIAGNOSIS — N200 Calculus of kidney: Secondary | ICD-10-CM | POA: Diagnosis not present

## 2020-12-05 DIAGNOSIS — Z794 Long term (current) use of insulin: Secondary | ICD-10-CM | POA: Diagnosis not present

## 2020-12-05 DIAGNOSIS — Z7984 Long term (current) use of oral hypoglycemic drugs: Secondary | ICD-10-CM | POA: Diagnosis not present

## 2020-12-05 DIAGNOSIS — R112 Nausea with vomiting, unspecified: Secondary | ICD-10-CM | POA: Diagnosis not present

## 2020-12-05 DIAGNOSIS — R1084 Generalized abdominal pain: Secondary | ICD-10-CM | POA: Diagnosis not present

## 2020-12-05 DIAGNOSIS — Z8673 Personal history of transient ischemic attack (TIA), and cerebral infarction without residual deficits: Secondary | ICD-10-CM | POA: Diagnosis not present

## 2020-12-05 DIAGNOSIS — I10 Essential (primary) hypertension: Secondary | ICD-10-CM | POA: Diagnosis not present

## 2020-12-05 DIAGNOSIS — R110 Nausea: Secondary | ICD-10-CM | POA: Diagnosis not present

## 2020-12-05 DIAGNOSIS — R1032 Left lower quadrant pain: Secondary | ICD-10-CM | POA: Diagnosis not present

## 2020-12-05 DIAGNOSIS — N390 Urinary tract infection, site not specified: Secondary | ICD-10-CM | POA: Diagnosis not present

## 2020-12-05 DIAGNOSIS — I252 Old myocardial infarction: Secondary | ICD-10-CM | POA: Diagnosis not present

## 2020-12-05 DIAGNOSIS — E119 Type 2 diabetes mellitus without complications: Secondary | ICD-10-CM | POA: Diagnosis not present

## 2020-12-05 DIAGNOSIS — R58 Hemorrhage, not elsewhere classified: Secondary | ICD-10-CM | POA: Diagnosis not present

## 2020-12-05 DIAGNOSIS — R1111 Vomiting without nausea: Secondary | ICD-10-CM | POA: Diagnosis not present

## 2020-12-05 DIAGNOSIS — N281 Cyst of kidney, acquired: Secondary | ICD-10-CM | POA: Diagnosis not present

## 2020-12-05 DIAGNOSIS — E785 Hyperlipidemia, unspecified: Secondary | ICD-10-CM | POA: Diagnosis not present

## 2020-12-09 DIAGNOSIS — R52 Pain, unspecified: Secondary | ICD-10-CM | POA: Diagnosis not present

## 2020-12-09 DIAGNOSIS — N83202 Unspecified ovarian cyst, left side: Secondary | ICD-10-CM | POA: Diagnosis not present

## 2020-12-09 DIAGNOSIS — I2510 Atherosclerotic heart disease of native coronary artery without angina pectoris: Secondary | ICD-10-CM | POA: Diagnosis not present

## 2020-12-09 DIAGNOSIS — I10 Essential (primary) hypertension: Secondary | ICD-10-CM | POA: Diagnosis not present

## 2020-12-09 DIAGNOSIS — R339 Retention of urine, unspecified: Secondary | ICD-10-CM | POA: Diagnosis not present

## 2020-12-09 DIAGNOSIS — N200 Calculus of kidney: Secondary | ICD-10-CM | POA: Diagnosis not present

## 2020-12-09 DIAGNOSIS — N390 Urinary tract infection, site not specified: Secondary | ICD-10-CM | POA: Diagnosis not present

## 2020-12-09 DIAGNOSIS — R1084 Generalized abdominal pain: Secondary | ICD-10-CM | POA: Diagnosis not present

## 2020-12-09 DIAGNOSIS — Z8673 Personal history of transient ischemic attack (TIA), and cerebral infarction without residual deficits: Secondary | ICD-10-CM | POA: Diagnosis not present

## 2020-12-09 DIAGNOSIS — I252 Old myocardial infarction: Secondary | ICD-10-CM | POA: Diagnosis not present

## 2020-12-09 DIAGNOSIS — N3289 Other specified disorders of bladder: Secondary | ICD-10-CM | POA: Diagnosis not present

## 2020-12-09 DIAGNOSIS — K219 Gastro-esophageal reflux disease without esophagitis: Secondary | ICD-10-CM | POA: Diagnosis not present

## 2020-12-09 DIAGNOSIS — E785 Hyperlipidemia, unspecified: Secondary | ICD-10-CM | POA: Diagnosis not present

## 2020-12-09 DIAGNOSIS — E119 Type 2 diabetes mellitus without complications: Secondary | ICD-10-CM | POA: Diagnosis not present

## 2020-12-12 DIAGNOSIS — R2689 Other abnormalities of gait and mobility: Secondary | ICD-10-CM | POA: Diagnosis not present

## 2020-12-12 DIAGNOSIS — I69351 Hemiplegia and hemiparesis following cerebral infarction affecting right dominant side: Secondary | ICD-10-CM | POA: Diagnosis not present

## 2020-12-12 DIAGNOSIS — K5900 Constipation, unspecified: Secondary | ICD-10-CM | POA: Diagnosis not present

## 2020-12-12 DIAGNOSIS — Z9181 History of falling: Secondary | ICD-10-CM | POA: Diagnosis not present

## 2020-12-12 DIAGNOSIS — B9620 Unspecified Escherichia coli [E. coli] as the cause of diseases classified elsewhere: Secondary | ICD-10-CM | POA: Diagnosis not present

## 2020-12-12 DIAGNOSIS — G8191 Hemiplegia, unspecified affecting right dominant side: Secondary | ICD-10-CM | POA: Diagnosis not present

## 2020-12-12 DIAGNOSIS — E1142 Type 2 diabetes mellitus with diabetic polyneuropathy: Secondary | ICD-10-CM | POA: Diagnosis not present

## 2020-12-12 DIAGNOSIS — I639 Cerebral infarction, unspecified: Secondary | ICD-10-CM | POA: Diagnosis not present

## 2020-12-12 DIAGNOSIS — E11621 Type 2 diabetes mellitus with foot ulcer: Secondary | ICD-10-CM | POA: Diagnosis not present

## 2020-12-12 DIAGNOSIS — I619 Nontraumatic intracerebral hemorrhage, unspecified: Secondary | ICD-10-CM | POA: Diagnosis not present

## 2020-12-12 DIAGNOSIS — I10 Essential (primary) hypertension: Secondary | ICD-10-CM | POA: Diagnosis not present

## 2020-12-12 DIAGNOSIS — E119 Type 2 diabetes mellitus without complications: Secondary | ICD-10-CM | POA: Diagnosis not present

## 2020-12-12 DIAGNOSIS — Z794 Long term (current) use of insulin: Secondary | ICD-10-CM | POA: Diagnosis not present

## 2020-12-12 DIAGNOSIS — N390 Urinary tract infection, site not specified: Secondary | ICD-10-CM | POA: Diagnosis not present

## 2020-12-17 DIAGNOSIS — R339 Retention of urine, unspecified: Secondary | ICD-10-CM | POA: Diagnosis not present

## 2020-12-17 DIAGNOSIS — Z960 Presence of urogenital implants: Secondary | ICD-10-CM | POA: Diagnosis not present

## 2020-12-19 DIAGNOSIS — R1030 Lower abdominal pain, unspecified: Secondary | ICD-10-CM | POA: Diagnosis not present

## 2020-12-19 DIAGNOSIS — R1111 Vomiting without nausea: Secondary | ICD-10-CM | POA: Diagnosis not present

## 2020-12-19 DIAGNOSIS — N281 Cyst of kidney, acquired: Secondary | ICD-10-CM | POA: Diagnosis not present

## 2020-12-19 DIAGNOSIS — I2510 Atherosclerotic heart disease of native coronary artery without angina pectoris: Secondary | ICD-10-CM | POA: Diagnosis not present

## 2020-12-19 DIAGNOSIS — E1140 Type 2 diabetes mellitus with diabetic neuropathy, unspecified: Secondary | ICD-10-CM | POA: Diagnosis not present

## 2020-12-19 DIAGNOSIS — Z794 Long term (current) use of insulin: Secondary | ICD-10-CM | POA: Diagnosis not present

## 2020-12-19 DIAGNOSIS — E860 Dehydration: Secondary | ICD-10-CM | POA: Diagnosis not present

## 2020-12-19 DIAGNOSIS — I252 Old myocardial infarction: Secondary | ICD-10-CM | POA: Diagnosis not present

## 2020-12-19 DIAGNOSIS — E119 Type 2 diabetes mellitus without complications: Secondary | ICD-10-CM | POA: Diagnosis not present

## 2020-12-19 DIAGNOSIS — K5900 Constipation, unspecified: Secondary | ICD-10-CM | POA: Diagnosis not present

## 2020-12-19 DIAGNOSIS — I69351 Hemiplegia and hemiparesis following cerebral infarction affecting right dominant side: Secondary | ICD-10-CM | POA: Diagnosis not present

## 2020-12-19 DIAGNOSIS — R110 Nausea: Secondary | ICD-10-CM | POA: Diagnosis not present

## 2020-12-19 DIAGNOSIS — R1084 Generalized abdominal pain: Secondary | ICD-10-CM | POA: Diagnosis not present

## 2020-12-19 DIAGNOSIS — I10 Essential (primary) hypertension: Secondary | ICD-10-CM | POA: Diagnosis not present

## 2020-12-19 DIAGNOSIS — D259 Leiomyoma of uterus, unspecified: Secondary | ICD-10-CM | POA: Diagnosis not present

## 2020-12-19 DIAGNOSIS — Z7984 Long term (current) use of oral hypoglycemic drugs: Secondary | ICD-10-CM | POA: Diagnosis not present

## 2020-12-19 DIAGNOSIS — R52 Pain, unspecified: Secondary | ICD-10-CM | POA: Diagnosis not present

## 2020-12-19 DIAGNOSIS — E785 Hyperlipidemia, unspecified: Secondary | ICD-10-CM | POA: Diagnosis not present

## 2020-12-21 DIAGNOSIS — I639 Cerebral infarction, unspecified: Secondary | ICD-10-CM | POA: Diagnosis not present

## 2020-12-27 DIAGNOSIS — R2681 Unsteadiness on feet: Secondary | ICD-10-CM | POA: Diagnosis not present

## 2020-12-27 DIAGNOSIS — Z9181 History of falling: Secondary | ICD-10-CM | POA: Diagnosis not present

## 2020-12-30 DIAGNOSIS — R1012 Left upper quadrant pain: Secondary | ICD-10-CM | POA: Diagnosis not present

## 2020-12-30 DIAGNOSIS — G4733 Obstructive sleep apnea (adult) (pediatric): Secondary | ICD-10-CM | POA: Diagnosis not present

## 2020-12-30 DIAGNOSIS — R110 Nausea: Secondary | ICD-10-CM | POA: Diagnosis not present

## 2020-12-30 DIAGNOSIS — E860 Dehydration: Secondary | ICD-10-CM | POA: Diagnosis not present

## 2020-12-30 DIAGNOSIS — R112 Nausea with vomiting, unspecified: Secondary | ICD-10-CM | POA: Diagnosis not present

## 2020-12-30 DIAGNOSIS — N838 Other noninflammatory disorders of ovary, fallopian tube and broad ligament: Secondary | ICD-10-CM | POA: Diagnosis not present

## 2020-12-30 DIAGNOSIS — F17210 Nicotine dependence, cigarettes, uncomplicated: Secondary | ICD-10-CM | POA: Diagnosis not present

## 2020-12-30 DIAGNOSIS — D259 Leiomyoma of uterus, unspecified: Secondary | ICD-10-CM | POA: Diagnosis not present

## 2020-12-30 DIAGNOSIS — R1084 Generalized abdominal pain: Secondary | ICD-10-CM | POA: Diagnosis not present

## 2020-12-30 DIAGNOSIS — I10 Essential (primary) hypertension: Secondary | ICD-10-CM | POA: Diagnosis not present

## 2020-12-30 DIAGNOSIS — R1030 Lower abdominal pain, unspecified: Secondary | ICD-10-CM | POA: Diagnosis not present

## 2020-12-30 DIAGNOSIS — R109 Unspecified abdominal pain: Secondary | ICD-10-CM | POA: Diagnosis not present

## 2020-12-30 DIAGNOSIS — I252 Old myocardial infarction: Secondary | ICD-10-CM | POA: Diagnosis not present

## 2020-12-30 DIAGNOSIS — E785 Hyperlipidemia, unspecified: Secondary | ICD-10-CM | POA: Diagnosis not present

## 2020-12-30 DIAGNOSIS — I2510 Atherosclerotic heart disease of native coronary artery without angina pectoris: Secondary | ICD-10-CM | POA: Diagnosis not present

## 2020-12-30 DIAGNOSIS — K219 Gastro-esophageal reflux disease without esophagitis: Secondary | ICD-10-CM | POA: Diagnosis not present

## 2020-12-30 DIAGNOSIS — R1032 Left lower quadrant pain: Secondary | ICD-10-CM | POA: Diagnosis not present

## 2021-01-01 DIAGNOSIS — I10 Essential (primary) hypertension: Secondary | ICD-10-CM | POA: Diagnosis not present

## 2021-01-01 DIAGNOSIS — F17210 Nicotine dependence, cigarettes, uncomplicated: Secondary | ICD-10-CM | POA: Diagnosis not present

## 2021-01-01 DIAGNOSIS — E119 Type 2 diabetes mellitus without complications: Secondary | ICD-10-CM | POA: Diagnosis not present

## 2021-01-01 DIAGNOSIS — F1410 Cocaine abuse, uncomplicated: Secondary | ICD-10-CM | POA: Diagnosis not present

## 2021-01-01 DIAGNOSIS — Z1612 Extended spectrum beta lactamase (ESBL) resistance: Secondary | ICD-10-CM | POA: Diagnosis not present

## 2021-01-01 DIAGNOSIS — I2510 Atherosclerotic heart disease of native coronary artery without angina pectoris: Secondary | ICD-10-CM | POA: Diagnosis not present

## 2021-01-01 DIAGNOSIS — N390 Urinary tract infection, site not specified: Secondary | ICD-10-CM | POA: Diagnosis not present

## 2021-01-01 DIAGNOSIS — Z87440 Personal history of urinary (tract) infections: Secondary | ICD-10-CM | POA: Diagnosis not present

## 2021-01-01 DIAGNOSIS — R339 Retention of urine, unspecified: Secondary | ICD-10-CM | POA: Diagnosis not present

## 2021-01-01 DIAGNOSIS — G4733 Obstructive sleep apnea (adult) (pediatric): Secondary | ICD-10-CM | POA: Diagnosis not present

## 2021-01-01 DIAGNOSIS — A499 Bacterial infection, unspecified: Secondary | ICD-10-CM | POA: Diagnosis not present

## 2021-01-01 DIAGNOSIS — B9689 Other specified bacterial agents as the cause of diseases classified elsewhere: Secondary | ICD-10-CM | POA: Diagnosis not present

## 2021-01-01 DIAGNOSIS — T83518A Infection and inflammatory reaction due to other urinary catheter, initial encounter: Secondary | ICD-10-CM | POA: Diagnosis not present

## 2021-01-01 DIAGNOSIS — Z8673 Personal history of transient ischemic attack (TIA), and cerebral infarction without residual deficits: Secondary | ICD-10-CM | POA: Diagnosis not present

## 2021-01-01 DIAGNOSIS — I252 Old myocardial infarction: Secondary | ICD-10-CM | POA: Diagnosis not present

## 2021-01-02 DIAGNOSIS — F1910 Other psychoactive substance abuse, uncomplicated: Secondary | ICD-10-CM | POA: Diagnosis not present

## 2021-01-02 DIAGNOSIS — R339 Retention of urine, unspecified: Secondary | ICD-10-CM | POA: Diagnosis not present

## 2021-01-02 DIAGNOSIS — G4733 Obstructive sleep apnea (adult) (pediatric): Secondary | ICD-10-CM | POA: Diagnosis not present

## 2021-01-02 DIAGNOSIS — Z720 Tobacco use: Secondary | ICD-10-CM | POA: Diagnosis not present

## 2021-01-02 DIAGNOSIS — Z8673 Personal history of transient ischemic attack (TIA), and cerebral infarction without residual deficits: Secondary | ICD-10-CM | POA: Diagnosis not present

## 2021-01-11 DIAGNOSIS — R739 Hyperglycemia, unspecified: Secondary | ICD-10-CM | POA: Diagnosis not present

## 2021-01-21 DIAGNOSIS — E1140 Type 2 diabetes mellitus with diabetic neuropathy, unspecified: Secondary | ICD-10-CM | POA: Diagnosis not present

## 2021-01-21 DIAGNOSIS — I69320 Aphasia following cerebral infarction: Secondary | ICD-10-CM | POA: Diagnosis not present

## 2021-01-21 DIAGNOSIS — I10 Essential (primary) hypertension: Secondary | ICD-10-CM | POA: Diagnosis not present

## 2021-01-21 DIAGNOSIS — I639 Cerebral infarction, unspecified: Secondary | ICD-10-CM | POA: Diagnosis not present

## 2021-01-21 DIAGNOSIS — I2510 Atherosclerotic heart disease of native coronary artery without angina pectoris: Secondary | ICD-10-CM | POA: Diagnosis not present

## 2021-01-21 DIAGNOSIS — Z466 Encounter for fitting and adjustment of urinary device: Secondary | ICD-10-CM | POA: Diagnosis not present

## 2021-01-21 DIAGNOSIS — R339 Retention of urine, unspecified: Secondary | ICD-10-CM | POA: Diagnosis not present

## 2021-01-21 DIAGNOSIS — I69351 Hemiplegia and hemiparesis following cerebral infarction affecting right dominant side: Secondary | ICD-10-CM | POA: Diagnosis not present

## 2021-01-21 DIAGNOSIS — N390 Urinary tract infection, site not specified: Secondary | ICD-10-CM | POA: Diagnosis not present

## 2021-01-21 DIAGNOSIS — D649 Anemia, unspecified: Secondary | ICD-10-CM | POA: Diagnosis not present

## 2021-01-23 DIAGNOSIS — F17210 Nicotine dependence, cigarettes, uncomplicated: Secondary | ICD-10-CM | POA: Diagnosis not present

## 2021-01-23 DIAGNOSIS — K5900 Constipation, unspecified: Secondary | ICD-10-CM | POA: Diagnosis not present

## 2021-01-23 DIAGNOSIS — Z79899 Other long term (current) drug therapy: Secondary | ICD-10-CM | POA: Diagnosis not present

## 2021-01-23 DIAGNOSIS — E1140 Type 2 diabetes mellitus with diabetic neuropathy, unspecified: Secondary | ICD-10-CM | POA: Diagnosis not present

## 2021-01-23 DIAGNOSIS — E1100 Type 2 diabetes mellitus with hyperosmolarity without nonketotic hyperglycemic-hyperosmolar coma (NKHHC): Secondary | ICD-10-CM | POA: Diagnosis not present

## 2021-01-23 DIAGNOSIS — I10 Essential (primary) hypertension: Secondary | ICD-10-CM | POA: Diagnosis not present

## 2021-01-23 DIAGNOSIS — G8191 Hemiplegia, unspecified affecting right dominant side: Secondary | ICD-10-CM | POA: Diagnosis not present

## 2021-01-23 DIAGNOSIS — Z0000 Encounter for general adult medical examination without abnormal findings: Secondary | ICD-10-CM | POA: Diagnosis not present

## 2021-01-23 DIAGNOSIS — I639 Cerebral infarction, unspecified: Secondary | ICD-10-CM | POA: Diagnosis not present

## 2021-01-23 DIAGNOSIS — I69351 Hemiplegia and hemiparesis following cerebral infarction affecting right dominant side: Secondary | ICD-10-CM | POA: Diagnosis not present

## 2021-01-23 DIAGNOSIS — Z794 Long term (current) use of insulin: Secondary | ICD-10-CM | POA: Diagnosis not present

## 2021-01-23 DIAGNOSIS — R2689 Other abnormalities of gait and mobility: Secondary | ICD-10-CM | POA: Diagnosis not present

## 2021-01-28 DIAGNOSIS — Z9181 History of falling: Secondary | ICD-10-CM | POA: Diagnosis not present

## 2021-01-28 DIAGNOSIS — R2681 Unsteadiness on feet: Secondary | ICD-10-CM | POA: Diagnosis not present

## 2021-02-02 DIAGNOSIS — Z20822 Contact with and (suspected) exposure to covid-19: Secondary | ICD-10-CM | POA: Diagnosis not present

## 2021-02-02 DIAGNOSIS — E1100 Type 2 diabetes mellitus with hyperosmolarity without nonketotic hyperglycemic-hyperosmolar coma (NKHHC): Secondary | ICD-10-CM | POA: Diagnosis not present

## 2021-02-02 DIAGNOSIS — Z794 Long term (current) use of insulin: Secondary | ICD-10-CM | POA: Diagnosis not present

## 2021-02-02 DIAGNOSIS — R112 Nausea with vomiting, unspecified: Secondary | ICD-10-CM | POA: Diagnosis not present

## 2021-02-02 DIAGNOSIS — Z7984 Long term (current) use of oral hypoglycemic drugs: Secondary | ICD-10-CM | POA: Diagnosis not present

## 2021-02-02 DIAGNOSIS — E785 Hyperlipidemia, unspecified: Secondary | ICD-10-CM | POA: Diagnosis not present

## 2021-02-02 DIAGNOSIS — R000 Tachycardia, unspecified: Secondary | ICD-10-CM | POA: Diagnosis not present

## 2021-02-02 DIAGNOSIS — F1410 Cocaine abuse, uncomplicated: Secondary | ICD-10-CM | POA: Diagnosis not present

## 2021-02-02 DIAGNOSIS — I083 Combined rheumatic disorders of mitral, aortic and tricuspid valves: Secondary | ICD-10-CM | POA: Diagnosis not present

## 2021-02-02 DIAGNOSIS — N179 Acute kidney failure, unspecified: Secondary | ICD-10-CM | POA: Diagnosis not present

## 2021-02-02 DIAGNOSIS — I10 Essential (primary) hypertension: Secondary | ICD-10-CM | POA: Diagnosis not present

## 2021-02-02 DIAGNOSIS — F1420 Cocaine dependence, uncomplicated: Secondary | ICD-10-CM | POA: Diagnosis not present

## 2021-02-02 DIAGNOSIS — N83202 Unspecified ovarian cyst, left side: Secondary | ICD-10-CM | POA: Diagnosis not present

## 2021-02-02 DIAGNOSIS — K760 Fatty (change of) liver, not elsewhere classified: Secondary | ICD-10-CM | POA: Diagnosis not present

## 2021-02-02 DIAGNOSIS — E1165 Type 2 diabetes mellitus with hyperglycemia: Secondary | ICD-10-CM | POA: Diagnosis not present

## 2021-02-02 DIAGNOSIS — D259 Leiomyoma of uterus, unspecified: Secondary | ICD-10-CM | POA: Diagnosis not present

## 2021-02-02 DIAGNOSIS — I2510 Atherosclerotic heart disease of native coronary artery without angina pectoris: Secondary | ICD-10-CM | POA: Diagnosis not present

## 2021-02-02 DIAGNOSIS — G4489 Other headache syndrome: Secondary | ICD-10-CM | POA: Diagnosis not present

## 2021-02-02 DIAGNOSIS — R110 Nausea: Secondary | ICD-10-CM | POA: Diagnosis not present

## 2021-02-02 DIAGNOSIS — E860 Dehydration: Secondary | ICD-10-CM | POA: Diagnosis not present

## 2021-02-02 DIAGNOSIS — F17210 Nicotine dependence, cigarettes, uncomplicated: Secondary | ICD-10-CM | POA: Diagnosis not present

## 2021-02-02 DIAGNOSIS — N390 Urinary tract infection, site not specified: Secondary | ICD-10-CM | POA: Diagnosis not present

## 2021-02-02 DIAGNOSIS — N3000 Acute cystitis without hematuria: Secondary | ICD-10-CM | POA: Diagnosis not present

## 2021-02-02 DIAGNOSIS — K5900 Constipation, unspecified: Secondary | ICD-10-CM | POA: Diagnosis not present

## 2021-02-02 DIAGNOSIS — E1140 Type 2 diabetes mellitus with diabetic neuropathy, unspecified: Secondary | ICD-10-CM | POA: Diagnosis not present

## 2021-02-02 DIAGNOSIS — Z79899 Other long term (current) drug therapy: Secondary | ICD-10-CM | POA: Diagnosis not present

## 2021-02-02 DIAGNOSIS — E870 Hyperosmolality and hypernatremia: Secondary | ICD-10-CM | POA: Diagnosis not present

## 2021-02-02 DIAGNOSIS — Z87440 Personal history of urinary (tract) infections: Secondary | ICD-10-CM | POA: Diagnosis not present

## 2021-02-06 DIAGNOSIS — E1140 Type 2 diabetes mellitus with diabetic neuropathy, unspecified: Secondary | ICD-10-CM | POA: Diagnosis not present

## 2021-02-06 DIAGNOSIS — R4182 Altered mental status, unspecified: Secondary | ICD-10-CM | POA: Diagnosis not present

## 2021-02-06 DIAGNOSIS — Z452 Encounter for adjustment and management of vascular access device: Secondary | ICD-10-CM | POA: Diagnosis not present

## 2021-02-06 DIAGNOSIS — R079 Chest pain, unspecified: Secondary | ICD-10-CM | POA: Diagnosis not present

## 2021-02-06 DIAGNOSIS — Z20822 Contact with and (suspected) exposure to covid-19: Secondary | ICD-10-CM | POA: Diagnosis not present

## 2021-02-06 DIAGNOSIS — R404 Transient alteration of awareness: Secondary | ICD-10-CM | POA: Diagnosis not present

## 2021-02-06 DIAGNOSIS — I69351 Hemiplegia and hemiparesis following cerebral infarction affecting right dominant side: Secondary | ICD-10-CM | POA: Diagnosis not present

## 2021-02-06 DIAGNOSIS — S42224A 2-part nondisplaced fracture of surgical neck of right humerus, initial encounter for closed fracture: Secondary | ICD-10-CM | POA: Diagnosis not present

## 2021-02-06 DIAGNOSIS — R4189 Other symptoms and signs involving cognitive functions and awareness: Secondary | ICD-10-CM | POA: Diagnosis not present

## 2021-02-06 DIAGNOSIS — N200 Calculus of kidney: Secondary | ICD-10-CM | POA: Diagnosis not present

## 2021-02-06 DIAGNOSIS — Z23 Encounter for immunization: Secondary | ICD-10-CM | POA: Diagnosis not present

## 2021-02-06 DIAGNOSIS — S42211A Unspecified displaced fracture of surgical neck of right humerus, initial encounter for closed fracture: Secondary | ICD-10-CM | POA: Diagnosis not present

## 2021-02-06 DIAGNOSIS — I252 Old myocardial infarction: Secondary | ICD-10-CM | POA: Diagnosis not present

## 2021-02-06 DIAGNOSIS — I129 Hypertensive chronic kidney disease with stage 1 through stage 4 chronic kidney disease, or unspecified chronic kidney disease: Secondary | ICD-10-CM | POA: Diagnosis not present

## 2021-02-06 DIAGNOSIS — K5900 Constipation, unspecified: Secondary | ICD-10-CM | POA: Diagnosis not present

## 2021-02-06 DIAGNOSIS — E0865 Diabetes mellitus due to underlying condition with hyperglycemia: Secondary | ICD-10-CM | POA: Diagnosis not present

## 2021-02-06 DIAGNOSIS — N319 Neuromuscular dysfunction of bladder, unspecified: Secondary | ICD-10-CM | POA: Diagnosis not present

## 2021-02-06 DIAGNOSIS — G8190 Hemiplegia, unspecified affecting unspecified side: Secondary | ICD-10-CM | POA: Diagnosis not present

## 2021-02-06 DIAGNOSIS — M25411 Effusion, right shoulder: Secondary | ICD-10-CM | POA: Diagnosis not present

## 2021-02-06 DIAGNOSIS — R2681 Unsteadiness on feet: Secondary | ICD-10-CM | POA: Diagnosis not present

## 2021-02-06 DIAGNOSIS — R6521 Severe sepsis with septic shock: Secondary | ICD-10-CM | POA: Diagnosis not present

## 2021-02-06 DIAGNOSIS — F32A Depression, unspecified: Secondary | ICD-10-CM | POA: Diagnosis not present

## 2021-02-06 DIAGNOSIS — Z8619 Personal history of other infectious and parasitic diseases: Secondary | ICD-10-CM | POA: Diagnosis not present

## 2021-02-06 DIAGNOSIS — M80821A Other osteoporosis with current pathological fracture, right humerus, initial encounter for fracture: Secondary | ICD-10-CM | POA: Diagnosis not present

## 2021-02-06 DIAGNOSIS — K6389 Other specified diseases of intestine: Secondary | ICD-10-CM | POA: Diagnosis not present

## 2021-02-06 DIAGNOSIS — R9082 White matter disease, unspecified: Secondary | ICD-10-CM | POA: Diagnosis not present

## 2021-02-06 DIAGNOSIS — B377 Candidal sepsis: Secondary | ICD-10-CM | POA: Diagnosis not present

## 2021-02-06 DIAGNOSIS — S42294A Other nondisplaced fracture of upper end of right humerus, initial encounter for closed fracture: Secondary | ICD-10-CM | POA: Diagnosis not present

## 2021-02-06 DIAGNOSIS — G8191 Hemiplegia, unspecified affecting right dominant side: Secondary | ICD-10-CM | POA: Diagnosis not present

## 2021-02-06 DIAGNOSIS — F1494 Cocaine use, unspecified with cocaine-induced mood disorder: Secondary | ICD-10-CM | POA: Diagnosis not present

## 2021-02-06 DIAGNOSIS — S42411A Displaced simple supracondylar fracture without intercondylar fracture of right humerus, initial encounter for closed fracture: Secondary | ICD-10-CM | POA: Diagnosis not present

## 2021-02-06 DIAGNOSIS — R000 Tachycardia, unspecified: Secondary | ICD-10-CM | POA: Diagnosis not present

## 2021-02-06 DIAGNOSIS — E1165 Type 2 diabetes mellitus with hyperglycemia: Secondary | ICD-10-CM | POA: Diagnosis not present

## 2021-02-06 DIAGNOSIS — R6520 Severe sepsis without septic shock: Secondary | ICD-10-CM | POA: Diagnosis not present

## 2021-02-06 DIAGNOSIS — R579 Shock, unspecified: Secondary | ICD-10-CM | POA: Diagnosis not present

## 2021-02-06 DIAGNOSIS — Z743 Need for continuous supervision: Secondary | ICD-10-CM | POA: Diagnosis not present

## 2021-02-06 DIAGNOSIS — Z794 Long term (current) use of insulin: Secondary | ICD-10-CM | POA: Diagnosis not present

## 2021-02-06 DIAGNOSIS — N390 Urinary tract infection, site not specified: Secondary | ICD-10-CM | POA: Diagnosis not present

## 2021-02-06 DIAGNOSIS — N183 Chronic kidney disease, stage 3 unspecified: Secondary | ICD-10-CM | POA: Diagnosis not present

## 2021-02-06 DIAGNOSIS — R4020 Unspecified coma: Secondary | ICD-10-CM | POA: Diagnosis not present

## 2021-02-06 DIAGNOSIS — B957 Other staphylococcus as the cause of diseases classified elsewhere: Secondary | ICD-10-CM | POA: Diagnosis not present

## 2021-02-06 DIAGNOSIS — G929 Unspecified toxic encephalopathy: Secondary | ICD-10-CM | POA: Diagnosis not present

## 2021-02-06 DIAGNOSIS — E1142 Type 2 diabetes mellitus with diabetic polyneuropathy: Secondary | ICD-10-CM | POA: Diagnosis not present

## 2021-02-06 DIAGNOSIS — N179 Acute kidney failure, unspecified: Secondary | ICD-10-CM | POA: Diagnosis not present

## 2021-02-06 DIAGNOSIS — A411 Sepsis due to other specified staphylococcus: Secondary | ICD-10-CM | POA: Diagnosis not present

## 2021-02-06 DIAGNOSIS — D649 Anemia, unspecified: Secondary | ICD-10-CM | POA: Diagnosis not present

## 2021-02-06 DIAGNOSIS — S42291D Other displaced fracture of upper end of right humerus, subsequent encounter for fracture with routine healing: Secondary | ICD-10-CM | POA: Diagnosis not present

## 2021-02-06 DIAGNOSIS — I2510 Atherosclerotic heart disease of native coronary artery without angina pectoris: Secondary | ICD-10-CM | POA: Diagnosis not present

## 2021-02-06 DIAGNOSIS — R109 Unspecified abdominal pain: Secondary | ICD-10-CM | POA: Diagnosis not present

## 2021-02-06 DIAGNOSIS — Z9181 History of falling: Secondary | ICD-10-CM | POA: Diagnosis not present

## 2021-02-06 DIAGNOSIS — A419 Sepsis, unspecified organism: Secondary | ICD-10-CM | POA: Diagnosis not present

## 2021-02-06 DIAGNOSIS — I6782 Cerebral ischemia: Secondary | ICD-10-CM | POA: Diagnosis not present

## 2021-02-06 DIAGNOSIS — M818 Other osteoporosis without current pathological fracture: Secondary | ICD-10-CM | POA: Diagnosis not present

## 2021-02-06 DIAGNOSIS — I700 Atherosclerosis of aorta: Secondary | ICD-10-CM | POA: Diagnosis not present

## 2021-02-06 DIAGNOSIS — F1490 Cocaine use, unspecified, uncomplicated: Secondary | ICD-10-CM | POA: Diagnosis not present

## 2021-02-06 DIAGNOSIS — E11319 Type 2 diabetes mellitus with unspecified diabetic retinopathy without macular edema: Secondary | ICD-10-CM | POA: Diagnosis not present

## 2021-02-06 DIAGNOSIS — I639 Cerebral infarction, unspecified: Secondary | ICD-10-CM | POA: Diagnosis not present

## 2021-02-06 DIAGNOSIS — S42251A Displaced fracture of greater tuberosity of right humerus, initial encounter for closed fracture: Secondary | ICD-10-CM | POA: Diagnosis not present

## 2021-02-06 DIAGNOSIS — B3749 Other urogenital candidiasis: Secondary | ICD-10-CM | POA: Diagnosis not present

## 2021-02-06 DIAGNOSIS — I10 Essential (primary) hypertension: Secondary | ICD-10-CM | POA: Diagnosis not present

## 2021-02-06 DIAGNOSIS — D259 Leiomyoma of uterus, unspecified: Secondary | ICD-10-CM | POA: Diagnosis not present

## 2021-02-06 DIAGNOSIS — R4781 Slurred speech: Secondary | ICD-10-CM | POA: Diagnosis not present

## 2021-02-06 DIAGNOSIS — M25521 Pain in right elbow: Secondary | ICD-10-CM | POA: Diagnosis not present

## 2021-02-06 DIAGNOSIS — R7881 Bacteremia: Secondary | ICD-10-CM | POA: Diagnosis not present

## 2021-02-06 DIAGNOSIS — E872 Acidosis: Secondary | ICD-10-CM | POA: Diagnosis not present

## 2021-02-06 DIAGNOSIS — E1149 Type 2 diabetes mellitus with other diabetic neurological complication: Secondary | ICD-10-CM | POA: Diagnosis not present

## 2021-02-06 DIAGNOSIS — E860 Dehydration: Secondary | ICD-10-CM | POA: Diagnosis not present

## 2021-02-06 DIAGNOSIS — E113299 Type 2 diabetes mellitus with mild nonproliferative diabetic retinopathy without macular edema, unspecified eye: Secondary | ICD-10-CM | POA: Diagnosis not present

## 2021-02-06 DIAGNOSIS — R339 Retention of urine, unspecified: Secondary | ICD-10-CM | POA: Diagnosis not present

## 2021-02-06 DIAGNOSIS — R112 Nausea with vomiting, unspecified: Secondary | ICD-10-CM | POA: Diagnosis not present

## 2021-02-06 DIAGNOSIS — Z1612 Extended spectrum beta lactamase (ESBL) resistance: Secondary | ICD-10-CM | POA: Diagnosis not present

## 2021-02-06 DIAGNOSIS — M1990 Unspecified osteoarthritis, unspecified site: Secondary | ICD-10-CM | POA: Diagnosis not present

## 2021-02-06 DIAGNOSIS — A4151 Sepsis due to Escherichia coli [E. coli]: Secondary | ICD-10-CM | POA: Diagnosis not present

## 2021-02-06 DIAGNOSIS — R600 Localized edema: Secondary | ICD-10-CM | POA: Diagnosis not present

## 2021-02-06 DIAGNOSIS — F1410 Cocaine abuse, uncomplicated: Secondary | ICD-10-CM | POA: Diagnosis not present

## 2021-02-06 DIAGNOSIS — N83202 Unspecified ovarian cyst, left side: Secondary | ICD-10-CM | POA: Diagnosis not present

## 2021-02-06 DIAGNOSIS — A498 Other bacterial infections of unspecified site: Secondary | ICD-10-CM | POA: Diagnosis not present

## 2021-03-24 DIAGNOSIS — I252 Old myocardial infarction: Secondary | ICD-10-CM | POA: Diagnosis not present

## 2021-03-24 DIAGNOSIS — E11319 Type 2 diabetes mellitus with unspecified diabetic retinopathy without macular edema: Secondary | ICD-10-CM | POA: Diagnosis not present

## 2021-03-24 DIAGNOSIS — S42291D Other displaced fracture of upper end of right humerus, subsequent encounter for fracture with routine healing: Secondary | ICD-10-CM | POA: Diagnosis not present

## 2021-03-24 DIAGNOSIS — K219 Gastro-esophageal reflux disease without esophagitis: Secondary | ICD-10-CM | POA: Diagnosis not present

## 2021-03-24 DIAGNOSIS — Z794 Long term (current) use of insulin: Secondary | ICD-10-CM | POA: Diagnosis not present

## 2021-03-24 DIAGNOSIS — G8190 Hemiplegia, unspecified affecting unspecified side: Secondary | ICD-10-CM | POA: Diagnosis not present

## 2021-03-24 DIAGNOSIS — F32A Depression, unspecified: Secondary | ICD-10-CM | POA: Diagnosis not present

## 2021-03-24 DIAGNOSIS — Z743 Need for continuous supervision: Secondary | ICD-10-CM | POA: Diagnosis not present

## 2021-03-24 DIAGNOSIS — G9340 Encephalopathy, unspecified: Secondary | ICD-10-CM | POA: Diagnosis not present

## 2021-03-24 DIAGNOSIS — R2681 Unsteadiness on feet: Secondary | ICD-10-CM | POA: Diagnosis not present

## 2021-03-24 DIAGNOSIS — I10 Essential (primary) hypertension: Secondary | ICD-10-CM | POA: Diagnosis not present

## 2021-03-24 DIAGNOSIS — F1494 Cocaine use, unspecified with cocaine-induced mood disorder: Secondary | ICD-10-CM | POA: Diagnosis not present

## 2021-03-24 DIAGNOSIS — G4730 Sleep apnea, unspecified: Secondary | ICD-10-CM | POA: Diagnosis not present

## 2021-03-24 DIAGNOSIS — E1165 Type 2 diabetes mellitus with hyperglycemia: Secondary | ICD-10-CM | POA: Diagnosis not present

## 2021-03-24 DIAGNOSIS — E1100 Type 2 diabetes mellitus with hyperosmolarity without nonketotic hyperglycemic-hyperosmolar coma (NKHHC): Secondary | ICD-10-CM | POA: Diagnosis not present

## 2021-03-24 DIAGNOSIS — A411 Sepsis due to other specified staphylococcus: Secondary | ICD-10-CM | POA: Diagnosis not present

## 2021-03-24 DIAGNOSIS — S42201D Unspecified fracture of upper end of right humerus, subsequent encounter for fracture with routine healing: Secondary | ICD-10-CM | POA: Diagnosis not present

## 2021-03-24 DIAGNOSIS — I2510 Atherosclerotic heart disease of native coronary artery without angina pectoris: Secondary | ICD-10-CM | POA: Diagnosis not present

## 2021-03-24 DIAGNOSIS — M1990 Unspecified osteoarthritis, unspecified site: Secondary | ICD-10-CM | POA: Diagnosis not present

## 2021-03-24 DIAGNOSIS — G629 Polyneuropathy, unspecified: Secondary | ICD-10-CM | POA: Diagnosis not present

## 2021-03-24 DIAGNOSIS — E113299 Type 2 diabetes mellitus with mild nonproliferative diabetic retinopathy without macular edema, unspecified eye: Secondary | ICD-10-CM | POA: Diagnosis not present

## 2021-03-24 DIAGNOSIS — I639 Cerebral infarction, unspecified: Secondary | ICD-10-CM | POA: Diagnosis not present

## 2021-03-24 DIAGNOSIS — E119 Type 2 diabetes mellitus without complications: Secondary | ICD-10-CM | POA: Diagnosis not present

## 2021-03-24 DIAGNOSIS — B377 Candidal sepsis: Secondary | ICD-10-CM | POA: Diagnosis not present

## 2021-03-24 DIAGNOSIS — E1142 Type 2 diabetes mellitus with diabetic polyneuropathy: Secondary | ICD-10-CM | POA: Diagnosis not present

## 2021-03-24 DIAGNOSIS — E785 Hyperlipidemia, unspecified: Secondary | ICD-10-CM | POA: Diagnosis not present

## 2021-03-24 DIAGNOSIS — E1140 Type 2 diabetes mellitus with diabetic neuropathy, unspecified: Secondary | ICD-10-CM | POA: Diagnosis not present

## 2021-03-24 DIAGNOSIS — S42411A Displaced simple supracondylar fracture without intercondylar fracture of right humerus, initial encounter for closed fracture: Secondary | ICD-10-CM | POA: Diagnosis not present

## 2021-03-24 DIAGNOSIS — Z9181 History of falling: Secondary | ICD-10-CM | POA: Diagnosis not present

## 2021-03-24 DIAGNOSIS — R6520 Severe sepsis without septic shock: Secondary | ICD-10-CM | POA: Diagnosis not present

## 2021-03-24 DIAGNOSIS — N183 Chronic kidney disease, stage 3 unspecified: Secondary | ICD-10-CM | POA: Diagnosis not present

## 2021-03-24 DIAGNOSIS — Z23 Encounter for immunization: Secondary | ICD-10-CM | POA: Diagnosis not present

## 2021-03-24 DIAGNOSIS — I129 Hypertensive chronic kidney disease with stage 1 through stage 4 chronic kidney disease, or unspecified chronic kidney disease: Secondary | ICD-10-CM | POA: Diagnosis not present

## 2021-03-24 DIAGNOSIS — D649 Anemia, unspecified: Secondary | ICD-10-CM | POA: Diagnosis not present

## 2021-03-27 DIAGNOSIS — G9340 Encephalopathy, unspecified: Secondary | ICD-10-CM | POA: Diagnosis not present

## 2021-03-27 DIAGNOSIS — I252 Old myocardial infarction: Secondary | ICD-10-CM | POA: Diagnosis not present

## 2021-03-27 DIAGNOSIS — E785 Hyperlipidemia, unspecified: Secondary | ICD-10-CM | POA: Diagnosis not present

## 2021-03-27 DIAGNOSIS — E1100 Type 2 diabetes mellitus with hyperosmolarity without nonketotic hyperglycemic-hyperosmolar coma (NKHHC): Secondary | ICD-10-CM | POA: Diagnosis not present

## 2021-03-27 DIAGNOSIS — E1165 Type 2 diabetes mellitus with hyperglycemia: Secondary | ICD-10-CM | POA: Diagnosis not present

## 2021-03-27 DIAGNOSIS — I10 Essential (primary) hypertension: Secondary | ICD-10-CM | POA: Diagnosis not present

## 2021-03-27 DIAGNOSIS — E119 Type 2 diabetes mellitus without complications: Secondary | ICD-10-CM | POA: Diagnosis not present

## 2021-03-27 DIAGNOSIS — I2510 Atherosclerotic heart disease of native coronary artery without angina pectoris: Secondary | ICD-10-CM | POA: Diagnosis not present

## 2021-03-27 DIAGNOSIS — K219 Gastro-esophageal reflux disease without esophagitis: Secondary | ICD-10-CM | POA: Diagnosis not present

## 2021-03-28 DIAGNOSIS — I10 Essential (primary) hypertension: Secondary | ICD-10-CM | POA: Diagnosis not present

## 2021-03-28 DIAGNOSIS — I639 Cerebral infarction, unspecified: Secondary | ICD-10-CM | POA: Diagnosis not present

## 2021-03-28 DIAGNOSIS — Z9181 History of falling: Secondary | ICD-10-CM | POA: Diagnosis not present

## 2021-03-28 DIAGNOSIS — E119 Type 2 diabetes mellitus without complications: Secondary | ICD-10-CM | POA: Diagnosis not present

## 2021-03-28 DIAGNOSIS — R2681 Unsteadiness on feet: Secondary | ICD-10-CM | POA: Diagnosis not present

## 2021-03-28 DIAGNOSIS — I2510 Atherosclerotic heart disease of native coronary artery without angina pectoris: Secondary | ICD-10-CM | POA: Diagnosis not present

## 2021-04-03 DIAGNOSIS — E785 Hyperlipidemia, unspecified: Secondary | ICD-10-CM | POA: Diagnosis not present

## 2021-04-03 DIAGNOSIS — G4730 Sleep apnea, unspecified: Secondary | ICD-10-CM | POA: Diagnosis not present

## 2021-04-03 DIAGNOSIS — I252 Old myocardial infarction: Secondary | ICD-10-CM | POA: Diagnosis not present

## 2021-04-03 DIAGNOSIS — F32A Depression, unspecified: Secondary | ICD-10-CM | POA: Diagnosis not present

## 2021-04-03 DIAGNOSIS — E119 Type 2 diabetes mellitus without complications: Secondary | ICD-10-CM | POA: Diagnosis not present

## 2021-04-03 DIAGNOSIS — I10 Essential (primary) hypertension: Secondary | ICD-10-CM | POA: Diagnosis not present

## 2021-04-03 DIAGNOSIS — I639 Cerebral infarction, unspecified: Secondary | ICD-10-CM | POA: Diagnosis not present

## 2021-04-03 DIAGNOSIS — G629 Polyneuropathy, unspecified: Secondary | ICD-10-CM | POA: Diagnosis not present

## 2021-04-03 DIAGNOSIS — I2510 Atherosclerotic heart disease of native coronary artery without angina pectoris: Secondary | ICD-10-CM | POA: Diagnosis not present

## 2021-04-08 DIAGNOSIS — I10 Essential (primary) hypertension: Secondary | ICD-10-CM | POA: Diagnosis not present

## 2021-04-08 DIAGNOSIS — R110 Nausea: Secondary | ICD-10-CM | POA: Diagnosis not present

## 2021-04-08 DIAGNOSIS — R6520 Severe sepsis without septic shock: Secondary | ICD-10-CM | POA: Diagnosis not present

## 2021-04-08 DIAGNOSIS — E1122 Type 2 diabetes mellitus with diabetic chronic kidney disease: Secondary | ICD-10-CM | POA: Diagnosis not present

## 2021-04-08 DIAGNOSIS — R197 Diarrhea, unspecified: Secondary | ICD-10-CM | POA: Diagnosis not present

## 2021-04-08 DIAGNOSIS — N1831 Chronic kidney disease, stage 3a: Secondary | ICD-10-CM | POA: Diagnosis not present

## 2021-04-08 DIAGNOSIS — D649 Anemia, unspecified: Secondary | ICD-10-CM | POA: Diagnosis not present

## 2021-04-08 DIAGNOSIS — E1165 Type 2 diabetes mellitus with hyperglycemia: Secondary | ICD-10-CM | POA: Diagnosis not present

## 2021-04-08 DIAGNOSIS — N390 Urinary tract infection, site not specified: Secondary | ICD-10-CM | POA: Diagnosis not present

## 2021-04-08 DIAGNOSIS — I129 Hypertensive chronic kidney disease with stage 1 through stage 4 chronic kidney disease, or unspecified chronic kidney disease: Secondary | ICD-10-CM | POA: Diagnosis not present

## 2021-04-08 DIAGNOSIS — S42201A Unspecified fracture of upper end of right humerus, initial encounter for closed fracture: Secondary | ICD-10-CM | POA: Diagnosis not present

## 2021-04-08 DIAGNOSIS — E876 Hypokalemia: Secondary | ICD-10-CM | POA: Diagnosis not present

## 2021-04-08 DIAGNOSIS — R000 Tachycardia, unspecified: Secondary | ICD-10-CM | POA: Diagnosis not present

## 2021-04-08 DIAGNOSIS — A419 Sepsis, unspecified organism: Secondary | ICD-10-CM | POA: Diagnosis not present

## 2021-04-08 DIAGNOSIS — K920 Hematemesis: Secondary | ICD-10-CM | POA: Diagnosis not present

## 2021-04-09 DIAGNOSIS — Z87440 Personal history of urinary (tract) infections: Secondary | ICD-10-CM | POA: Diagnosis not present

## 2021-04-09 DIAGNOSIS — I10 Essential (primary) hypertension: Secondary | ICD-10-CM | POA: Diagnosis not present

## 2021-04-09 DIAGNOSIS — E1149 Type 2 diabetes mellitus with other diabetic neurological complication: Secondary | ICD-10-CM | POA: Diagnosis not present

## 2021-04-09 DIAGNOSIS — I2510 Atherosclerotic heart disease of native coronary artery without angina pectoris: Secondary | ICD-10-CM | POA: Diagnosis not present

## 2021-04-09 DIAGNOSIS — F1410 Cocaine abuse, uncomplicated: Secondary | ICD-10-CM | POA: Diagnosis not present

## 2021-04-09 DIAGNOSIS — N3000 Acute cystitis without hematuria: Secondary | ICD-10-CM | POA: Diagnosis not present

## 2021-04-09 DIAGNOSIS — R109 Unspecified abdominal pain: Secondary | ICD-10-CM | POA: Diagnosis not present

## 2021-04-09 DIAGNOSIS — F17200 Nicotine dependence, unspecified, uncomplicated: Secondary | ICD-10-CM | POA: Diagnosis not present

## 2021-04-09 DIAGNOSIS — E1165 Type 2 diabetes mellitus with hyperglycemia: Secondary | ICD-10-CM | POA: Diagnosis not present

## 2021-04-10 DIAGNOSIS — I10 Essential (primary) hypertension: Secondary | ICD-10-CM | POA: Diagnosis not present

## 2021-04-10 DIAGNOSIS — Z87440 Personal history of urinary (tract) infections: Secondary | ICD-10-CM | POA: Diagnosis not present

## 2021-04-10 DIAGNOSIS — N390 Urinary tract infection, site not specified: Secondary | ICD-10-CM | POA: Diagnosis not present

## 2021-04-10 DIAGNOSIS — F1410 Cocaine abuse, uncomplicated: Secondary | ICD-10-CM | POA: Diagnosis not present

## 2021-04-10 DIAGNOSIS — I2510 Atherosclerotic heart disease of native coronary artery without angina pectoris: Secondary | ICD-10-CM | POA: Diagnosis not present

## 2021-04-10 DIAGNOSIS — E1149 Type 2 diabetes mellitus with other diabetic neurological complication: Secondary | ICD-10-CM | POA: Diagnosis not present

## 2021-04-10 DIAGNOSIS — D649 Anemia, unspecified: Secondary | ICD-10-CM | POA: Diagnosis not present

## 2021-04-10 DIAGNOSIS — R6520 Severe sepsis without septic shock: Secondary | ICD-10-CM | POA: Diagnosis not present

## 2021-04-10 DIAGNOSIS — F17200 Nicotine dependence, unspecified, uncomplicated: Secondary | ICD-10-CM | POA: Diagnosis not present

## 2021-04-10 DIAGNOSIS — E1165 Type 2 diabetes mellitus with hyperglycemia: Secondary | ICD-10-CM | POA: Diagnosis not present

## 2021-04-10 DIAGNOSIS — N3000 Acute cystitis without hematuria: Secondary | ICD-10-CM | POA: Diagnosis not present

## 2021-04-11 DIAGNOSIS — E1165 Type 2 diabetes mellitus with hyperglycemia: Secondary | ICD-10-CM | POA: Diagnosis not present

## 2021-04-11 DIAGNOSIS — R6520 Severe sepsis without septic shock: Secondary | ICD-10-CM | POA: Diagnosis not present

## 2021-04-11 DIAGNOSIS — D649 Anemia, unspecified: Secondary | ICD-10-CM | POA: Diagnosis not present

## 2021-04-11 DIAGNOSIS — N390 Urinary tract infection, site not specified: Secondary | ICD-10-CM | POA: Diagnosis not present

## 2021-04-12 DIAGNOSIS — N390 Urinary tract infection, site not specified: Secondary | ICD-10-CM | POA: Diagnosis not present

## 2021-04-12 DIAGNOSIS — E1165 Type 2 diabetes mellitus with hyperglycemia: Secondary | ICD-10-CM | POA: Diagnosis not present

## 2021-04-12 DIAGNOSIS — D649 Anemia, unspecified: Secondary | ICD-10-CM | POA: Diagnosis not present

## 2021-04-12 DIAGNOSIS — R6520 Severe sepsis without septic shock: Secondary | ICD-10-CM | POA: Diagnosis not present

## 2021-04-13 DIAGNOSIS — S42021A Displaced fracture of shaft of right clavicle, initial encounter for closed fracture: Secondary | ICD-10-CM | POA: Diagnosis not present

## 2021-04-13 DIAGNOSIS — E1165 Type 2 diabetes mellitus with hyperglycemia: Secondary | ICD-10-CM | POA: Diagnosis not present

## 2021-04-13 DIAGNOSIS — S42294A Other nondisplaced fracture of upper end of right humerus, initial encounter for closed fracture: Secondary | ICD-10-CM | POA: Diagnosis not present

## 2021-04-13 DIAGNOSIS — R6520 Severe sepsis without septic shock: Secondary | ICD-10-CM | POA: Diagnosis not present

## 2021-04-13 DIAGNOSIS — D649 Anemia, unspecified: Secondary | ICD-10-CM | POA: Diagnosis not present

## 2021-04-13 DIAGNOSIS — N390 Urinary tract infection, site not specified: Secondary | ICD-10-CM | POA: Diagnosis not present

## 2021-04-13 DIAGNOSIS — R112 Nausea with vomiting, unspecified: Secondary | ICD-10-CM | POA: Diagnosis not present

## 2021-04-14 DIAGNOSIS — R6520 Severe sepsis without septic shock: Secondary | ICD-10-CM | POA: Diagnosis not present

## 2021-04-14 DIAGNOSIS — N390 Urinary tract infection, site not specified: Secondary | ICD-10-CM | POA: Diagnosis not present

## 2021-04-14 DIAGNOSIS — D649 Anemia, unspecified: Secondary | ICD-10-CM | POA: Diagnosis not present

## 2021-04-14 DIAGNOSIS — E1165 Type 2 diabetes mellitus with hyperglycemia: Secondary | ICD-10-CM | POA: Diagnosis not present

## 2021-04-15 DIAGNOSIS — R6520 Severe sepsis without septic shock: Secondary | ICD-10-CM | POA: Diagnosis not present

## 2021-04-15 DIAGNOSIS — E1165 Type 2 diabetes mellitus with hyperglycemia: Secondary | ICD-10-CM | POA: Diagnosis not present

## 2021-04-15 DIAGNOSIS — N390 Urinary tract infection, site not specified: Secondary | ICD-10-CM | POA: Diagnosis not present

## 2021-04-15 DIAGNOSIS — D649 Anemia, unspecified: Secondary | ICD-10-CM | POA: Diagnosis not present

## 2021-04-16 DIAGNOSIS — N390 Urinary tract infection, site not specified: Secondary | ICD-10-CM | POA: Diagnosis not present

## 2021-04-16 DIAGNOSIS — E1165 Type 2 diabetes mellitus with hyperglycemia: Secondary | ICD-10-CM | POA: Diagnosis not present

## 2021-04-16 DIAGNOSIS — D649 Anemia, unspecified: Secondary | ICD-10-CM | POA: Diagnosis not present

## 2021-04-16 DIAGNOSIS — R6520 Severe sepsis without septic shock: Secondary | ICD-10-CM | POA: Diagnosis not present

## 2021-04-17 DIAGNOSIS — N390 Urinary tract infection, site not specified: Secondary | ICD-10-CM | POA: Diagnosis not present

## 2021-04-17 DIAGNOSIS — D649 Anemia, unspecified: Secondary | ICD-10-CM | POA: Diagnosis not present

## 2021-04-17 DIAGNOSIS — E1165 Type 2 diabetes mellitus with hyperglycemia: Secondary | ICD-10-CM | POA: Diagnosis not present

## 2021-04-17 DIAGNOSIS — R6520 Severe sepsis without septic shock: Secondary | ICD-10-CM | POA: Diagnosis not present

## 2021-04-18 DIAGNOSIS — E1165 Type 2 diabetes mellitus with hyperglycemia: Secondary | ICD-10-CM | POA: Diagnosis not present

## 2021-04-18 DIAGNOSIS — N390 Urinary tract infection, site not specified: Secondary | ICD-10-CM | POA: Diagnosis not present

## 2021-04-18 DIAGNOSIS — D649 Anemia, unspecified: Secondary | ICD-10-CM | POA: Diagnosis not present

## 2021-04-18 DIAGNOSIS — R6520 Severe sepsis without septic shock: Secondary | ICD-10-CM | POA: Diagnosis not present

## 2021-04-19 DIAGNOSIS — I10 Essential (primary) hypertension: Secondary | ICD-10-CM | POA: Diagnosis not present

## 2021-04-19 DIAGNOSIS — K922 Gastrointestinal hemorrhage, unspecified: Secondary | ICD-10-CM | POA: Diagnosis not present

## 2021-04-19 DIAGNOSIS — I639 Cerebral infarction, unspecified: Secondary | ICD-10-CM | POA: Diagnosis not present

## 2021-04-19 DIAGNOSIS — E1122 Type 2 diabetes mellitus with diabetic chronic kidney disease: Secondary | ICD-10-CM | POA: Diagnosis not present

## 2021-04-19 DIAGNOSIS — E559 Vitamin D deficiency, unspecified: Secondary | ICD-10-CM | POA: Diagnosis not present

## 2021-04-19 DIAGNOSIS — I69351 Hemiplegia and hemiparesis following cerebral infarction affecting right dominant side: Secondary | ICD-10-CM | POA: Diagnosis not present

## 2021-04-19 DIAGNOSIS — R6520 Severe sepsis without septic shock: Secondary | ICD-10-CM | POA: Diagnosis not present

## 2021-04-19 DIAGNOSIS — R2681 Unsteadiness on feet: Secondary | ICD-10-CM | POA: Diagnosis not present

## 2021-04-19 DIAGNOSIS — F17210 Nicotine dependence, cigarettes, uncomplicated: Secondary | ICD-10-CM | POA: Diagnosis not present

## 2021-04-19 DIAGNOSIS — R2689 Other abnormalities of gait and mobility: Secondary | ICD-10-CM | POA: Diagnosis not present

## 2021-04-19 DIAGNOSIS — D649 Anemia, unspecified: Secondary | ICD-10-CM | POA: Diagnosis not present

## 2021-04-19 DIAGNOSIS — F17290 Nicotine dependence, other tobacco product, uncomplicated: Secondary | ICD-10-CM | POA: Diagnosis not present

## 2021-04-19 DIAGNOSIS — Z79899 Other long term (current) drug therapy: Secondary | ICD-10-CM | POA: Diagnosis not present

## 2021-04-19 DIAGNOSIS — Z720 Tobacco use: Secondary | ICD-10-CM | POA: Diagnosis not present

## 2021-04-19 DIAGNOSIS — S42201A Unspecified fracture of upper end of right humerus, initial encounter for closed fracture: Secondary | ICD-10-CM | POA: Diagnosis not present

## 2021-04-19 DIAGNOSIS — I2510 Atherosclerotic heart disease of native coronary artery without angina pectoris: Secondary | ICD-10-CM | POA: Diagnosis not present

## 2021-04-19 DIAGNOSIS — Z9181 History of falling: Secondary | ICD-10-CM | POA: Diagnosis not present

## 2021-04-19 DIAGNOSIS — N1831 Chronic kidney disease, stage 3a: Secondary | ICD-10-CM | POA: Diagnosis not present

## 2021-04-19 DIAGNOSIS — A419 Sepsis, unspecified organism: Secondary | ICD-10-CM | POA: Diagnosis not present

## 2021-04-19 DIAGNOSIS — K3184 Gastroparesis: Secondary | ICD-10-CM | POA: Diagnosis not present

## 2021-04-19 DIAGNOSIS — N390 Urinary tract infection, site not specified: Secondary | ICD-10-CM | POA: Diagnosis not present

## 2021-04-19 DIAGNOSIS — S42201D Unspecified fracture of upper end of right humerus, subsequent encounter for fracture with routine healing: Secondary | ICD-10-CM | POA: Diagnosis not present

## 2021-04-19 DIAGNOSIS — D638 Anemia in other chronic diseases classified elsewhere: Secondary | ICD-10-CM | POA: Diagnosis not present

## 2021-04-19 DIAGNOSIS — S42224D 2-part nondisplaced fracture of surgical neck of right humerus, subsequent encounter for fracture with routine healing: Secondary | ICD-10-CM | POA: Diagnosis not present

## 2021-04-19 DIAGNOSIS — E039 Hypothyroidism, unspecified: Secondary | ICD-10-CM | POA: Diagnosis not present

## 2021-04-19 DIAGNOSIS — R1319 Other dysphagia: Secondary | ICD-10-CM | POA: Diagnosis not present

## 2021-04-19 DIAGNOSIS — K2270 Barrett's esophagus without dysplasia: Secondary | ICD-10-CM | POA: Diagnosis not present

## 2021-04-19 DIAGNOSIS — E119 Type 2 diabetes mellitus without complications: Secondary | ICD-10-CM | POA: Diagnosis not present

## 2021-04-19 DIAGNOSIS — E1141 Type 2 diabetes mellitus with diabetic mononeuropathy: Secondary | ICD-10-CM | POA: Diagnosis not present

## 2021-04-19 DIAGNOSIS — E876 Hypokalemia: Secondary | ICD-10-CM | POA: Diagnosis not present

## 2021-04-19 DIAGNOSIS — Z716 Tobacco abuse counseling: Secondary | ICD-10-CM | POA: Diagnosis not present

## 2021-04-19 DIAGNOSIS — I129 Hypertensive chronic kidney disease with stage 1 through stage 4 chronic kidney disease, or unspecified chronic kidney disease: Secondary | ICD-10-CM | POA: Diagnosis not present

## 2021-04-19 DIAGNOSIS — E1165 Type 2 diabetes mellitus with hyperglycemia: Secondary | ICD-10-CM | POA: Diagnosis not present

## 2021-04-19 DIAGNOSIS — K920 Hematemesis: Secondary | ICD-10-CM | POA: Diagnosis not present

## 2021-04-19 DIAGNOSIS — M6281 Muscle weakness (generalized): Secondary | ICD-10-CM | POA: Diagnosis not present

## 2021-04-19 DIAGNOSIS — G4733 Obstructive sleep apnea (adult) (pediatric): Secondary | ICD-10-CM | POA: Diagnosis not present

## 2021-04-21 DIAGNOSIS — I10 Essential (primary) hypertension: Secondary | ICD-10-CM | POA: Diagnosis not present

## 2021-04-21 DIAGNOSIS — K2270 Barrett's esophagus without dysplasia: Secondary | ICD-10-CM | POA: Diagnosis not present

## 2021-04-21 DIAGNOSIS — Z720 Tobacco use: Secondary | ICD-10-CM | POA: Diagnosis not present

## 2021-04-21 DIAGNOSIS — F17210 Nicotine dependence, cigarettes, uncomplicated: Secondary | ICD-10-CM | POA: Diagnosis not present

## 2021-04-21 DIAGNOSIS — I2510 Atherosclerotic heart disease of native coronary artery without angina pectoris: Secondary | ICD-10-CM | POA: Diagnosis not present

## 2021-04-21 DIAGNOSIS — K922 Gastrointestinal hemorrhage, unspecified: Secondary | ICD-10-CM | POA: Diagnosis not present

## 2021-04-21 DIAGNOSIS — E559 Vitamin D deficiency, unspecified: Secondary | ICD-10-CM | POA: Diagnosis not present

## 2021-04-21 DIAGNOSIS — G4733 Obstructive sleep apnea (adult) (pediatric): Secondary | ICD-10-CM | POA: Diagnosis not present

## 2021-04-21 DIAGNOSIS — E119 Type 2 diabetes mellitus without complications: Secondary | ICD-10-CM | POA: Diagnosis not present

## 2021-04-21 DIAGNOSIS — E1141 Type 2 diabetes mellitus with diabetic mononeuropathy: Secondary | ICD-10-CM | POA: Diagnosis not present

## 2021-04-22 DIAGNOSIS — E559 Vitamin D deficiency, unspecified: Secondary | ICD-10-CM | POA: Diagnosis not present

## 2021-04-22 DIAGNOSIS — I2510 Atherosclerotic heart disease of native coronary artery without angina pectoris: Secondary | ICD-10-CM | POA: Diagnosis not present

## 2021-04-22 DIAGNOSIS — K922 Gastrointestinal hemorrhage, unspecified: Secondary | ICD-10-CM | POA: Diagnosis not present

## 2021-04-22 DIAGNOSIS — E119 Type 2 diabetes mellitus without complications: Secondary | ICD-10-CM | POA: Diagnosis not present

## 2021-04-22 DIAGNOSIS — K2270 Barrett's esophagus without dysplasia: Secondary | ICD-10-CM | POA: Diagnosis not present

## 2021-04-22 DIAGNOSIS — G4733 Obstructive sleep apnea (adult) (pediatric): Secondary | ICD-10-CM | POA: Diagnosis not present

## 2021-04-22 DIAGNOSIS — Z720 Tobacco use: Secondary | ICD-10-CM | POA: Diagnosis not present

## 2021-04-22 DIAGNOSIS — I10 Essential (primary) hypertension: Secondary | ICD-10-CM | POA: Diagnosis not present

## 2021-04-22 DIAGNOSIS — E1141 Type 2 diabetes mellitus with diabetic mononeuropathy: Secondary | ICD-10-CM | POA: Diagnosis not present

## 2021-04-23 DIAGNOSIS — K922 Gastrointestinal hemorrhage, unspecified: Secondary | ICD-10-CM | POA: Diagnosis not present

## 2021-04-23 DIAGNOSIS — I2510 Atherosclerotic heart disease of native coronary artery without angina pectoris: Secondary | ICD-10-CM | POA: Diagnosis not present

## 2021-04-23 DIAGNOSIS — I10 Essential (primary) hypertension: Secondary | ICD-10-CM | POA: Diagnosis not present

## 2021-04-23 DIAGNOSIS — E559 Vitamin D deficiency, unspecified: Secondary | ICD-10-CM | POA: Diagnosis not present

## 2021-04-23 DIAGNOSIS — G4733 Obstructive sleep apnea (adult) (pediatric): Secondary | ICD-10-CM | POA: Diagnosis not present

## 2021-04-23 DIAGNOSIS — E1141 Type 2 diabetes mellitus with diabetic mononeuropathy: Secondary | ICD-10-CM | POA: Diagnosis not present

## 2021-04-23 DIAGNOSIS — Z720 Tobacco use: Secondary | ICD-10-CM | POA: Diagnosis not present

## 2021-04-23 DIAGNOSIS — E119 Type 2 diabetes mellitus without complications: Secondary | ICD-10-CM | POA: Diagnosis not present

## 2021-04-23 DIAGNOSIS — K2270 Barrett's esophagus without dysplasia: Secondary | ICD-10-CM | POA: Diagnosis not present

## 2021-04-24 DIAGNOSIS — K920 Hematemesis: Secondary | ICD-10-CM | POA: Diagnosis not present

## 2021-04-24 DIAGNOSIS — D638 Anemia in other chronic diseases classified elsewhere: Secondary | ICD-10-CM | POA: Diagnosis not present

## 2021-04-24 DIAGNOSIS — N390 Urinary tract infection, site not specified: Secondary | ICD-10-CM | POA: Diagnosis not present

## 2021-04-24 DIAGNOSIS — E876 Hypokalemia: Secondary | ICD-10-CM | POA: Diagnosis not present

## 2021-04-26 DIAGNOSIS — I639 Cerebral infarction, unspecified: Secondary | ICD-10-CM | POA: Diagnosis not present

## 2021-04-27 DIAGNOSIS — Z9181 History of falling: Secondary | ICD-10-CM | POA: Diagnosis not present

## 2021-04-27 DIAGNOSIS — R2681 Unsteadiness on feet: Secondary | ICD-10-CM | POA: Diagnosis not present

## 2021-05-01 DIAGNOSIS — I10 Essential (primary) hypertension: Secondary | ICD-10-CM | POA: Diagnosis not present

## 2021-05-01 DIAGNOSIS — E119 Type 2 diabetes mellitus without complications: Secondary | ICD-10-CM | POA: Diagnosis not present

## 2021-05-01 DIAGNOSIS — K3184 Gastroparesis: Secondary | ICD-10-CM | POA: Diagnosis not present

## 2021-05-01 DIAGNOSIS — F17210 Nicotine dependence, cigarettes, uncomplicated: Secondary | ICD-10-CM | POA: Diagnosis not present

## 2021-05-06 DIAGNOSIS — E119 Type 2 diabetes mellitus without complications: Secondary | ICD-10-CM | POA: Diagnosis not present

## 2021-05-06 DIAGNOSIS — E559 Vitamin D deficiency, unspecified: Secondary | ICD-10-CM | POA: Diagnosis not present

## 2021-05-06 DIAGNOSIS — I2510 Atherosclerotic heart disease of native coronary artery without angina pectoris: Secondary | ICD-10-CM | POA: Diagnosis not present

## 2021-05-06 DIAGNOSIS — G4733 Obstructive sleep apnea (adult) (pediatric): Secondary | ICD-10-CM | POA: Diagnosis not present

## 2021-05-06 DIAGNOSIS — Z720 Tobacco use: Secondary | ICD-10-CM | POA: Diagnosis not present

## 2021-05-06 DIAGNOSIS — K2270 Barrett's esophagus without dysplasia: Secondary | ICD-10-CM | POA: Diagnosis not present

## 2021-05-06 DIAGNOSIS — E1141 Type 2 diabetes mellitus with diabetic mononeuropathy: Secondary | ICD-10-CM | POA: Diagnosis not present

## 2021-05-06 DIAGNOSIS — I10 Essential (primary) hypertension: Secondary | ICD-10-CM | POA: Diagnosis not present

## 2021-05-06 DIAGNOSIS — K922 Gastrointestinal hemorrhage, unspecified: Secondary | ICD-10-CM | POA: Diagnosis not present

## 2021-05-14 DIAGNOSIS — E559 Vitamin D deficiency, unspecified: Secondary | ICD-10-CM | POA: Diagnosis not present

## 2021-05-14 DIAGNOSIS — K2270 Barrett's esophagus without dysplasia: Secondary | ICD-10-CM | POA: Diagnosis not present

## 2021-05-14 DIAGNOSIS — I2510 Atherosclerotic heart disease of native coronary artery without angina pectoris: Secondary | ICD-10-CM | POA: Diagnosis not present

## 2021-05-14 DIAGNOSIS — I10 Essential (primary) hypertension: Secondary | ICD-10-CM | POA: Diagnosis not present

## 2021-05-14 DIAGNOSIS — E1141 Type 2 diabetes mellitus with diabetic mononeuropathy: Secondary | ICD-10-CM | POA: Diagnosis not present

## 2021-05-14 DIAGNOSIS — G4733 Obstructive sleep apnea (adult) (pediatric): Secondary | ICD-10-CM | POA: Diagnosis not present

## 2021-05-14 DIAGNOSIS — Z720 Tobacco use: Secondary | ICD-10-CM | POA: Diagnosis not present

## 2021-05-14 DIAGNOSIS — K922 Gastrointestinal hemorrhage, unspecified: Secondary | ICD-10-CM | POA: Diagnosis not present

## 2021-05-14 DIAGNOSIS — E119 Type 2 diabetes mellitus without complications: Secondary | ICD-10-CM | POA: Diagnosis not present

## 2021-05-15 DIAGNOSIS — E876 Hypokalemia: Secondary | ICD-10-CM | POA: Diagnosis not present

## 2021-05-15 DIAGNOSIS — G4733 Obstructive sleep apnea (adult) (pediatric): Secondary | ICD-10-CM | POA: Diagnosis not present

## 2021-05-15 DIAGNOSIS — D638 Anemia in other chronic diseases classified elsewhere: Secondary | ICD-10-CM | POA: Diagnosis not present

## 2021-05-15 DIAGNOSIS — E119 Type 2 diabetes mellitus without complications: Secondary | ICD-10-CM | POA: Diagnosis not present

## 2021-05-18 DIAGNOSIS — G4733 Obstructive sleep apnea (adult) (pediatric): Secondary | ICD-10-CM | POA: Diagnosis not present

## 2021-05-18 DIAGNOSIS — Z720 Tobacco use: Secondary | ICD-10-CM | POA: Diagnosis not present

## 2021-05-18 DIAGNOSIS — I2510 Atherosclerotic heart disease of native coronary artery without angina pectoris: Secondary | ICD-10-CM | POA: Diagnosis not present

## 2021-05-18 DIAGNOSIS — E119 Type 2 diabetes mellitus without complications: Secondary | ICD-10-CM | POA: Diagnosis not present

## 2021-05-18 DIAGNOSIS — K922 Gastrointestinal hemorrhage, unspecified: Secondary | ICD-10-CM | POA: Diagnosis not present

## 2021-05-18 DIAGNOSIS — E559 Vitamin D deficiency, unspecified: Secondary | ICD-10-CM | POA: Diagnosis not present

## 2021-05-18 DIAGNOSIS — I10 Essential (primary) hypertension: Secondary | ICD-10-CM | POA: Diagnosis not present

## 2021-05-18 DIAGNOSIS — E1141 Type 2 diabetes mellitus with diabetic mononeuropathy: Secondary | ICD-10-CM | POA: Diagnosis not present

## 2021-05-18 DIAGNOSIS — K2270 Barrett's esophagus without dysplasia: Secondary | ICD-10-CM | POA: Diagnosis not present

## 2021-05-19 ENCOUNTER — Other Ambulatory Visit: Payer: Self-pay

## 2021-05-19 NOTE — Patient Outreach (Signed)
[facility]) Care Management  05/19/2021  Stacy Brandt March 08, 1961 [id number]     Transition of Care Referral  Referral Date: 05/19/2021 Referral [place] Discharge Report Date of Discharge: 05/18/2021 [place]     Referral received. Upon chart review noted that patient does not have [facility] provider-followed by [facility] MDs.    Plan: RN CM will close referral at this time.    Enzo Montgomery, RN,BSN,CCM [place] Management Telephonic Care Management Coordinator Direct Phone: [phone] Toll Free: [phone] Fax: [phone]

## 2021-05-20 DIAGNOSIS — E039 Hypothyroidism, unspecified: Secondary | ICD-10-CM | POA: Diagnosis not present

## 2021-05-20 DIAGNOSIS — Z79899 Other long term (current) drug therapy: Secondary | ICD-10-CM | POA: Diagnosis not present

## 2021-05-20 DIAGNOSIS — E559 Vitamin D deficiency, unspecified: Secondary | ICD-10-CM | POA: Diagnosis not present

## 2021-05-21 DIAGNOSIS — D519 Vitamin B12 deficiency anemia, unspecified: Secondary | ICD-10-CM | POA: Diagnosis not present

## 2021-05-21 DIAGNOSIS — I10 Essential (primary) hypertension: Secondary | ICD-10-CM | POA: Diagnosis not present

## 2021-05-21 DIAGNOSIS — E119 Type 2 diabetes mellitus without complications: Secondary | ICD-10-CM | POA: Diagnosis not present

## 2021-05-21 DIAGNOSIS — K219 Gastro-esophageal reflux disease without esophagitis: Secondary | ICD-10-CM | POA: Diagnosis not present

## 2021-05-23 DIAGNOSIS — E113399 Type 2 diabetes mellitus with moderate nonproliferative diabetic retinopathy without macular edema, unspecified eye: Secondary | ICD-10-CM | POA: Diagnosis not present

## 2021-05-23 DIAGNOSIS — E1122 Type 2 diabetes mellitus with diabetic chronic kidney disease: Secondary | ICD-10-CM | POA: Diagnosis not present

## 2021-05-23 DIAGNOSIS — N1831 Chronic kidney disease, stage 3a: Secondary | ICD-10-CM | POA: Diagnosis not present

## 2021-05-23 DIAGNOSIS — F0390 Unspecified dementia without behavioral disturbance: Secondary | ICD-10-CM | POA: Diagnosis not present

## 2021-05-23 DIAGNOSIS — I69351 Hemiplegia and hemiparesis following cerebral infarction affecting right dominant side: Secondary | ICD-10-CM | POA: Diagnosis not present

## 2021-05-23 DIAGNOSIS — I129 Hypertensive chronic kidney disease with stage 1 through stage 4 chronic kidney disease, or unspecified chronic kidney disease: Secondary | ICD-10-CM | POA: Diagnosis not present

## 2021-05-23 DIAGNOSIS — S42201D Unspecified fracture of upper end of right humerus, subsequent encounter for fracture with routine healing: Secondary | ICD-10-CM | POA: Diagnosis not present

## 2021-05-23 DIAGNOSIS — G8929 Other chronic pain: Secondary | ICD-10-CM | POA: Diagnosis not present

## 2021-05-23 DIAGNOSIS — D631 Anemia in chronic kidney disease: Secondary | ICD-10-CM | POA: Diagnosis not present

## 2021-05-28 DIAGNOSIS — R2681 Unsteadiness on feet: Secondary | ICD-10-CM | POA: Diagnosis not present

## 2021-05-28 DIAGNOSIS — Z9181 History of falling: Secondary | ICD-10-CM | POA: Diagnosis not present

## 2021-05-29 DIAGNOSIS — F1999 Other psychoactive substance use, unspecified with unspecified psychoactive substance-induced disorder: Secondary | ICD-10-CM | POA: Diagnosis not present

## 2021-05-29 DIAGNOSIS — F0151 Vascular dementia with behavioral disturbance: Secondary | ICD-10-CM | POA: Diagnosis not present

## 2021-05-29 DIAGNOSIS — F331 Major depressive disorder, recurrent, moderate: Secondary | ICD-10-CM | POA: Diagnosis not present

## 2021-05-31 DIAGNOSIS — F331 Major depressive disorder, recurrent, moderate: Secondary | ICD-10-CM | POA: Diagnosis not present

## 2021-06-02 DIAGNOSIS — G8929 Other chronic pain: Secondary | ICD-10-CM | POA: Diagnosis not present

## 2021-06-02 DIAGNOSIS — I69351 Hemiplegia and hemiparesis following cerebral infarction affecting right dominant side: Secondary | ICD-10-CM | POA: Diagnosis not present

## 2021-06-02 DIAGNOSIS — D631 Anemia in chronic kidney disease: Secondary | ICD-10-CM | POA: Diagnosis not present

## 2021-06-02 DIAGNOSIS — S42201D Unspecified fracture of upper end of right humerus, subsequent encounter for fracture with routine healing: Secondary | ICD-10-CM | POA: Diagnosis not present

## 2021-06-02 DIAGNOSIS — F0390 Unspecified dementia without behavioral disturbance: Secondary | ICD-10-CM | POA: Diagnosis not present

## 2021-06-02 DIAGNOSIS — E113399 Type 2 diabetes mellitus with moderate nonproliferative diabetic retinopathy without macular edema, unspecified eye: Secondary | ICD-10-CM | POA: Diagnosis not present

## 2021-06-02 DIAGNOSIS — I129 Hypertensive chronic kidney disease with stage 1 through stage 4 chronic kidney disease, or unspecified chronic kidney disease: Secondary | ICD-10-CM | POA: Diagnosis not present

## 2021-06-02 DIAGNOSIS — E1122 Type 2 diabetes mellitus with diabetic chronic kidney disease: Secondary | ICD-10-CM | POA: Diagnosis not present

## 2021-06-02 DIAGNOSIS — N1831 Chronic kidney disease, stage 3a: Secondary | ICD-10-CM | POA: Diagnosis not present

## 2021-06-04 DIAGNOSIS — E113399 Type 2 diabetes mellitus with moderate nonproliferative diabetic retinopathy without macular edema, unspecified eye: Secondary | ICD-10-CM | POA: Diagnosis not present

## 2021-06-04 DIAGNOSIS — N1831 Chronic kidney disease, stage 3a: Secondary | ICD-10-CM | POA: Diagnosis not present

## 2021-06-04 DIAGNOSIS — D631 Anemia in chronic kidney disease: Secondary | ICD-10-CM | POA: Diagnosis not present

## 2021-06-04 DIAGNOSIS — G8929 Other chronic pain: Secondary | ICD-10-CM | POA: Diagnosis not present

## 2021-06-04 DIAGNOSIS — F0390 Unspecified dementia without behavioral disturbance: Secondary | ICD-10-CM | POA: Diagnosis not present

## 2021-06-04 DIAGNOSIS — E1122 Type 2 diabetes mellitus with diabetic chronic kidney disease: Secondary | ICD-10-CM | POA: Diagnosis not present

## 2021-06-04 DIAGNOSIS — I129 Hypertensive chronic kidney disease with stage 1 through stage 4 chronic kidney disease, or unspecified chronic kidney disease: Secondary | ICD-10-CM | POA: Diagnosis not present

## 2021-06-04 DIAGNOSIS — I69351 Hemiplegia and hemiparesis following cerebral infarction affecting right dominant side: Secondary | ICD-10-CM | POA: Diagnosis not present

## 2021-06-04 DIAGNOSIS — S42201D Unspecified fracture of upper end of right humerus, subsequent encounter for fracture with routine healing: Secondary | ICD-10-CM | POA: Diagnosis not present

## 2021-06-05 DIAGNOSIS — F0390 Unspecified dementia without behavioral disturbance: Secondary | ICD-10-CM | POA: Diagnosis not present

## 2021-06-05 DIAGNOSIS — E1122 Type 2 diabetes mellitus with diabetic chronic kidney disease: Secondary | ICD-10-CM | POA: Diagnosis not present

## 2021-06-05 DIAGNOSIS — S42201D Unspecified fracture of upper end of right humerus, subsequent encounter for fracture with routine healing: Secondary | ICD-10-CM | POA: Diagnosis not present

## 2021-06-05 DIAGNOSIS — I69351 Hemiplegia and hemiparesis following cerebral infarction affecting right dominant side: Secondary | ICD-10-CM | POA: Diagnosis not present

## 2021-06-05 DIAGNOSIS — N1831 Chronic kidney disease, stage 3a: Secondary | ICD-10-CM | POA: Diagnosis not present

## 2021-06-05 DIAGNOSIS — E113399 Type 2 diabetes mellitus with moderate nonproliferative diabetic retinopathy without macular edema, unspecified eye: Secondary | ICD-10-CM | POA: Diagnosis not present

## 2021-06-05 DIAGNOSIS — I129 Hypertensive chronic kidney disease with stage 1 through stage 4 chronic kidney disease, or unspecified chronic kidney disease: Secondary | ICD-10-CM | POA: Diagnosis not present

## 2021-06-05 DIAGNOSIS — G8929 Other chronic pain: Secondary | ICD-10-CM | POA: Diagnosis not present

## 2021-06-05 DIAGNOSIS — D631 Anemia in chronic kidney disease: Secondary | ICD-10-CM | POA: Diagnosis not present

## 2021-06-10 DIAGNOSIS — F0390 Unspecified dementia without behavioral disturbance: Secondary | ICD-10-CM | POA: Diagnosis not present

## 2021-06-10 DIAGNOSIS — G8929 Other chronic pain: Secondary | ICD-10-CM | POA: Diagnosis not present

## 2021-06-10 DIAGNOSIS — N1831 Chronic kidney disease, stage 3a: Secondary | ICD-10-CM | POA: Diagnosis not present

## 2021-06-10 DIAGNOSIS — S42201D Unspecified fracture of upper end of right humerus, subsequent encounter for fracture with routine healing: Secondary | ICD-10-CM | POA: Diagnosis not present

## 2021-06-10 DIAGNOSIS — E1122 Type 2 diabetes mellitus with diabetic chronic kidney disease: Secondary | ICD-10-CM | POA: Diagnosis not present

## 2021-06-10 DIAGNOSIS — E113399 Type 2 diabetes mellitus with moderate nonproliferative diabetic retinopathy without macular edema, unspecified eye: Secondary | ICD-10-CM | POA: Diagnosis not present

## 2021-06-10 DIAGNOSIS — D631 Anemia in chronic kidney disease: Secondary | ICD-10-CM | POA: Diagnosis not present

## 2021-06-10 DIAGNOSIS — I69351 Hemiplegia and hemiparesis following cerebral infarction affecting right dominant side: Secondary | ICD-10-CM | POA: Diagnosis not present

## 2021-06-10 DIAGNOSIS — I129 Hypertensive chronic kidney disease with stage 1 through stage 4 chronic kidney disease, or unspecified chronic kidney disease: Secondary | ICD-10-CM | POA: Diagnosis not present

## 2021-06-12 DIAGNOSIS — D631 Anemia in chronic kidney disease: Secondary | ICD-10-CM | POA: Diagnosis not present

## 2021-06-12 DIAGNOSIS — I69351 Hemiplegia and hemiparesis following cerebral infarction affecting right dominant side: Secondary | ICD-10-CM | POA: Diagnosis not present

## 2021-06-12 DIAGNOSIS — E113399 Type 2 diabetes mellitus with moderate nonproliferative diabetic retinopathy without macular edema, unspecified eye: Secondary | ICD-10-CM | POA: Diagnosis not present

## 2021-06-12 DIAGNOSIS — G8929 Other chronic pain: Secondary | ICD-10-CM | POA: Diagnosis not present

## 2021-06-12 DIAGNOSIS — N1831 Chronic kidney disease, stage 3a: Secondary | ICD-10-CM | POA: Diagnosis not present

## 2021-06-12 DIAGNOSIS — E1122 Type 2 diabetes mellitus with diabetic chronic kidney disease: Secondary | ICD-10-CM | POA: Diagnosis not present

## 2021-06-12 DIAGNOSIS — I129 Hypertensive chronic kidney disease with stage 1 through stage 4 chronic kidney disease, or unspecified chronic kidney disease: Secondary | ICD-10-CM | POA: Diagnosis not present

## 2021-06-12 DIAGNOSIS — F0390 Unspecified dementia without behavioral disturbance: Secondary | ICD-10-CM | POA: Diagnosis not present

## 2021-06-12 DIAGNOSIS — S42201D Unspecified fracture of upper end of right humerus, subsequent encounter for fracture with routine healing: Secondary | ICD-10-CM | POA: Diagnosis not present

## 2021-06-14 DIAGNOSIS — F331 Major depressive disorder, recurrent, moderate: Secondary | ICD-10-CM | POA: Diagnosis not present

## 2021-06-15 DIAGNOSIS — I69351 Hemiplegia and hemiparesis following cerebral infarction affecting right dominant side: Secondary | ICD-10-CM | POA: Diagnosis not present

## 2021-06-15 DIAGNOSIS — E1122 Type 2 diabetes mellitus with diabetic chronic kidney disease: Secondary | ICD-10-CM | POA: Diagnosis not present

## 2021-06-15 DIAGNOSIS — I129 Hypertensive chronic kidney disease with stage 1 through stage 4 chronic kidney disease, or unspecified chronic kidney disease: Secondary | ICD-10-CM | POA: Diagnosis not present

## 2021-06-16 DIAGNOSIS — D631 Anemia in chronic kidney disease: Secondary | ICD-10-CM | POA: Diagnosis not present

## 2021-06-16 DIAGNOSIS — I129 Hypertensive chronic kidney disease with stage 1 through stage 4 chronic kidney disease, or unspecified chronic kidney disease: Secondary | ICD-10-CM | POA: Diagnosis not present

## 2021-06-16 DIAGNOSIS — E1122 Type 2 diabetes mellitus with diabetic chronic kidney disease: Secondary | ICD-10-CM | POA: Diagnosis not present

## 2021-06-16 DIAGNOSIS — N1831 Chronic kidney disease, stage 3a: Secondary | ICD-10-CM | POA: Diagnosis not present

## 2021-06-16 DIAGNOSIS — F0390 Unspecified dementia without behavioral disturbance: Secondary | ICD-10-CM | POA: Diagnosis not present

## 2021-06-16 DIAGNOSIS — S42201D Unspecified fracture of upper end of right humerus, subsequent encounter for fracture with routine healing: Secondary | ICD-10-CM | POA: Diagnosis not present

## 2021-06-16 DIAGNOSIS — G8929 Other chronic pain: Secondary | ICD-10-CM | POA: Diagnosis not present

## 2021-06-16 DIAGNOSIS — I69351 Hemiplegia and hemiparesis following cerebral infarction affecting right dominant side: Secondary | ICD-10-CM | POA: Diagnosis not present

## 2021-06-16 DIAGNOSIS — E113399 Type 2 diabetes mellitus with moderate nonproliferative diabetic retinopathy without macular edema, unspecified eye: Secondary | ICD-10-CM | POA: Diagnosis not present

## 2021-06-17 DIAGNOSIS — E1122 Type 2 diabetes mellitus with diabetic chronic kidney disease: Secondary | ICD-10-CM | POA: Diagnosis not present

## 2021-06-17 DIAGNOSIS — D631 Anemia in chronic kidney disease: Secondary | ICD-10-CM | POA: Diagnosis not present

## 2021-06-17 DIAGNOSIS — N1831 Chronic kidney disease, stage 3a: Secondary | ICD-10-CM | POA: Diagnosis not present

## 2021-06-17 DIAGNOSIS — S42201D Unspecified fracture of upper end of right humerus, subsequent encounter for fracture with routine healing: Secondary | ICD-10-CM | POA: Diagnosis not present

## 2021-06-17 DIAGNOSIS — E113399 Type 2 diabetes mellitus with moderate nonproliferative diabetic retinopathy without macular edema, unspecified eye: Secondary | ICD-10-CM | POA: Diagnosis not present

## 2021-06-17 DIAGNOSIS — I69351 Hemiplegia and hemiparesis following cerebral infarction affecting right dominant side: Secondary | ICD-10-CM | POA: Diagnosis not present

## 2021-06-17 DIAGNOSIS — G8929 Other chronic pain: Secondary | ICD-10-CM | POA: Diagnosis not present

## 2021-06-17 DIAGNOSIS — I129 Hypertensive chronic kidney disease with stage 1 through stage 4 chronic kidney disease, or unspecified chronic kidney disease: Secondary | ICD-10-CM | POA: Diagnosis not present

## 2021-06-17 DIAGNOSIS — F0390 Unspecified dementia without behavioral disturbance: Secondary | ICD-10-CM | POA: Diagnosis not present

## 2021-06-18 DIAGNOSIS — G894 Chronic pain syndrome: Secondary | ICD-10-CM | POA: Diagnosis not present

## 2021-06-21 DIAGNOSIS — I639 Cerebral infarction, unspecified: Secondary | ICD-10-CM | POA: Diagnosis not present

## 2021-06-21 DIAGNOSIS — F331 Major depressive disorder, recurrent, moderate: Secondary | ICD-10-CM | POA: Diagnosis not present

## 2021-06-22 DIAGNOSIS — K219 Gastro-esophageal reflux disease without esophagitis: Secondary | ICD-10-CM | POA: Diagnosis not present

## 2021-06-22 DIAGNOSIS — E119 Type 2 diabetes mellitus without complications: Secondary | ICD-10-CM | POA: Diagnosis not present

## 2021-06-22 DIAGNOSIS — I129 Hypertensive chronic kidney disease with stage 1 through stage 4 chronic kidney disease, or unspecified chronic kidney disease: Secondary | ICD-10-CM | POA: Diagnosis not present

## 2021-06-22 DIAGNOSIS — I10 Essential (primary) hypertension: Secondary | ICD-10-CM | POA: Diagnosis not present

## 2021-06-22 DIAGNOSIS — E1122 Type 2 diabetes mellitus with diabetic chronic kidney disease: Secondary | ICD-10-CM | POA: Diagnosis not present

## 2021-06-22 DIAGNOSIS — F0390 Unspecified dementia without behavioral disturbance: Secondary | ICD-10-CM | POA: Diagnosis not present

## 2021-06-22 DIAGNOSIS — N1831 Chronic kidney disease, stage 3a: Secondary | ICD-10-CM | POA: Diagnosis not present

## 2021-06-22 DIAGNOSIS — S42201D Unspecified fracture of upper end of right humerus, subsequent encounter for fracture with routine healing: Secondary | ICD-10-CM | POA: Diagnosis not present

## 2021-06-22 DIAGNOSIS — I252 Old myocardial infarction: Secondary | ICD-10-CM | POA: Diagnosis not present

## 2021-06-22 DIAGNOSIS — I2510 Atherosclerotic heart disease of native coronary artery without angina pectoris: Secondary | ICD-10-CM | POA: Diagnosis not present

## 2021-06-22 DIAGNOSIS — J449 Chronic obstructive pulmonary disease, unspecified: Secondary | ICD-10-CM | POA: Diagnosis not present

## 2021-06-22 DIAGNOSIS — E785 Hyperlipidemia, unspecified: Secondary | ICD-10-CM | POA: Diagnosis not present

## 2021-06-22 DIAGNOSIS — I69351 Hemiplegia and hemiparesis following cerebral infarction affecting right dominant side: Secondary | ICD-10-CM | POA: Diagnosis not present

## 2021-06-22 DIAGNOSIS — F17200 Nicotine dependence, unspecified, uncomplicated: Secondary | ICD-10-CM | POA: Diagnosis not present

## 2021-06-22 DIAGNOSIS — Z993 Dependence on wheelchair: Secondary | ICD-10-CM | POA: Diagnosis not present

## 2021-06-22 DIAGNOSIS — Z794 Long term (current) use of insulin: Secondary | ICD-10-CM | POA: Diagnosis not present

## 2021-06-22 DIAGNOSIS — G8929 Other chronic pain: Secondary | ICD-10-CM | POA: Diagnosis not present

## 2021-06-22 DIAGNOSIS — Z955 Presence of coronary angioplasty implant and graft: Secondary | ICD-10-CM | POA: Diagnosis not present

## 2021-06-22 DIAGNOSIS — E113399 Type 2 diabetes mellitus with moderate nonproliferative diabetic retinopathy without macular edema, unspecified eye: Secondary | ICD-10-CM | POA: Diagnosis not present

## 2021-06-22 DIAGNOSIS — Z8679 Personal history of other diseases of the circulatory system: Secondary | ICD-10-CM | POA: Diagnosis not present

## 2021-06-22 DIAGNOSIS — D631 Anemia in chronic kidney disease: Secondary | ICD-10-CM | POA: Diagnosis not present

## 2021-06-23 DIAGNOSIS — E113399 Type 2 diabetes mellitus with moderate nonproliferative diabetic retinopathy without macular edema, unspecified eye: Secondary | ICD-10-CM | POA: Diagnosis not present

## 2021-06-23 DIAGNOSIS — D631 Anemia in chronic kidney disease: Secondary | ICD-10-CM | POA: Diagnosis not present

## 2021-06-23 DIAGNOSIS — F0390 Unspecified dementia without behavioral disturbance: Secondary | ICD-10-CM | POA: Diagnosis not present

## 2021-06-23 DIAGNOSIS — G8929 Other chronic pain: Secondary | ICD-10-CM | POA: Diagnosis not present

## 2021-06-23 DIAGNOSIS — I69351 Hemiplegia and hemiparesis following cerebral infarction affecting right dominant side: Secondary | ICD-10-CM | POA: Diagnosis not present

## 2021-06-23 DIAGNOSIS — N1831 Chronic kidney disease, stage 3a: Secondary | ICD-10-CM | POA: Diagnosis not present

## 2021-06-23 DIAGNOSIS — E1122 Type 2 diabetes mellitus with diabetic chronic kidney disease: Secondary | ICD-10-CM | POA: Diagnosis not present

## 2021-06-23 DIAGNOSIS — I129 Hypertensive chronic kidney disease with stage 1 through stage 4 chronic kidney disease, or unspecified chronic kidney disease: Secondary | ICD-10-CM | POA: Diagnosis not present

## 2021-06-23 DIAGNOSIS — S42201D Unspecified fracture of upper end of right humerus, subsequent encounter for fracture with routine healing: Secondary | ICD-10-CM | POA: Diagnosis not present

## 2021-06-27 DIAGNOSIS — R1030 Lower abdominal pain, unspecified: Secondary | ICD-10-CM | POA: Diagnosis not present

## 2021-06-27 DIAGNOSIS — K5900 Constipation, unspecified: Secondary | ICD-10-CM | POA: Diagnosis not present

## 2021-06-27 DIAGNOSIS — N200 Calculus of kidney: Secondary | ICD-10-CM | POA: Diagnosis not present

## 2021-06-27 DIAGNOSIS — Z8673 Personal history of transient ischemic attack (TIA), and cerebral infarction without residual deficits: Secondary | ICD-10-CM | POA: Diagnosis not present

## 2021-06-27 DIAGNOSIS — Z9181 History of falling: Secondary | ICD-10-CM | POA: Diagnosis not present

## 2021-06-27 DIAGNOSIS — E785 Hyperlipidemia, unspecified: Secondary | ICD-10-CM | POA: Diagnosis not present

## 2021-06-27 DIAGNOSIS — D259 Leiomyoma of uterus, unspecified: Secondary | ICD-10-CM | POA: Diagnosis not present

## 2021-06-27 DIAGNOSIS — E119 Type 2 diabetes mellitus without complications: Secondary | ICD-10-CM | POA: Diagnosis not present

## 2021-06-27 DIAGNOSIS — F32A Depression, unspecified: Secondary | ICD-10-CM | POA: Diagnosis not present

## 2021-06-27 DIAGNOSIS — R1084 Generalized abdominal pain: Secondary | ICD-10-CM | POA: Diagnosis not present

## 2021-06-27 DIAGNOSIS — R739 Hyperglycemia, unspecified: Secondary | ICD-10-CM | POA: Diagnosis not present

## 2021-06-27 DIAGNOSIS — M1990 Unspecified osteoarthritis, unspecified site: Secondary | ICD-10-CM | POA: Diagnosis not present

## 2021-06-27 DIAGNOSIS — N3289 Other specified disorders of bladder: Secondary | ICD-10-CM | POA: Diagnosis not present

## 2021-06-27 DIAGNOSIS — I252 Old myocardial infarction: Secondary | ICD-10-CM | POA: Diagnosis not present

## 2021-06-27 DIAGNOSIS — R0902 Hypoxemia: Secondary | ICD-10-CM | POA: Diagnosis not present

## 2021-06-27 DIAGNOSIS — R300 Dysuria: Secondary | ICD-10-CM | POA: Diagnosis not present

## 2021-06-27 DIAGNOSIS — R2681 Unsteadiness on feet: Secondary | ICD-10-CM | POA: Diagnosis not present

## 2021-06-27 DIAGNOSIS — K219 Gastro-esophageal reflux disease without esophagitis: Secondary | ICD-10-CM | POA: Diagnosis not present

## 2021-06-27 DIAGNOSIS — N390 Urinary tract infection, site not specified: Secondary | ICD-10-CM | POA: Diagnosis not present

## 2021-06-27 DIAGNOSIS — N179 Acute kidney failure, unspecified: Secondary | ICD-10-CM | POA: Diagnosis not present

## 2021-06-28 DIAGNOSIS — N200 Calculus of kidney: Secondary | ICD-10-CM | POA: Diagnosis not present

## 2021-06-28 DIAGNOSIS — N3289 Other specified disorders of bladder: Secondary | ICD-10-CM | POA: Diagnosis not present

## 2021-06-28 DIAGNOSIS — K5900 Constipation, unspecified: Secondary | ICD-10-CM | POA: Diagnosis not present

## 2021-06-28 DIAGNOSIS — D259 Leiomyoma of uterus, unspecified: Secondary | ICD-10-CM | POA: Diagnosis not present

## 2021-07-01 DIAGNOSIS — F0390 Unspecified dementia without behavioral disturbance: Secondary | ICD-10-CM | POA: Diagnosis not present

## 2021-07-01 DIAGNOSIS — D631 Anemia in chronic kidney disease: Secondary | ICD-10-CM | POA: Diagnosis not present

## 2021-07-01 DIAGNOSIS — E1122 Type 2 diabetes mellitus with diabetic chronic kidney disease: Secondary | ICD-10-CM | POA: Diagnosis not present

## 2021-07-01 DIAGNOSIS — I69351 Hemiplegia and hemiparesis following cerebral infarction affecting right dominant side: Secondary | ICD-10-CM | POA: Diagnosis not present

## 2021-07-01 DIAGNOSIS — S42201D Unspecified fracture of upper end of right humerus, subsequent encounter for fracture with routine healing: Secondary | ICD-10-CM | POA: Diagnosis not present

## 2021-07-01 DIAGNOSIS — I129 Hypertensive chronic kidney disease with stage 1 through stage 4 chronic kidney disease, or unspecified chronic kidney disease: Secondary | ICD-10-CM | POA: Diagnosis not present

## 2021-07-01 DIAGNOSIS — G8929 Other chronic pain: Secondary | ICD-10-CM | POA: Diagnosis not present

## 2021-07-01 DIAGNOSIS — E113399 Type 2 diabetes mellitus with moderate nonproliferative diabetic retinopathy without macular edema, unspecified eye: Secondary | ICD-10-CM | POA: Diagnosis not present

## 2021-07-01 DIAGNOSIS — N1831 Chronic kidney disease, stage 3a: Secondary | ICD-10-CM | POA: Diagnosis not present

## 2021-07-02 DIAGNOSIS — S42201D Unspecified fracture of upper end of right humerus, subsequent encounter for fracture with routine healing: Secondary | ICD-10-CM | POA: Diagnosis not present

## 2021-07-02 DIAGNOSIS — E113399 Type 2 diabetes mellitus with moderate nonproliferative diabetic retinopathy without macular edema, unspecified eye: Secondary | ICD-10-CM | POA: Diagnosis not present

## 2021-07-02 DIAGNOSIS — D631 Anemia in chronic kidney disease: Secondary | ICD-10-CM | POA: Diagnosis not present

## 2021-07-02 DIAGNOSIS — F1999 Other psychoactive substance use, unspecified with unspecified psychoactive substance-induced disorder: Secondary | ICD-10-CM | POA: Diagnosis not present

## 2021-07-02 DIAGNOSIS — F0390 Unspecified dementia without behavioral disturbance: Secondary | ICD-10-CM | POA: Diagnosis not present

## 2021-07-02 DIAGNOSIS — E1122 Type 2 diabetes mellitus with diabetic chronic kidney disease: Secondary | ICD-10-CM | POA: Diagnosis not present

## 2021-07-02 DIAGNOSIS — I129 Hypertensive chronic kidney disease with stage 1 through stage 4 chronic kidney disease, or unspecified chronic kidney disease: Secondary | ICD-10-CM | POA: Diagnosis not present

## 2021-07-02 DIAGNOSIS — N390 Urinary tract infection, site not specified: Secondary | ICD-10-CM | POA: Diagnosis not present

## 2021-07-02 DIAGNOSIS — I69351 Hemiplegia and hemiparesis following cerebral infarction affecting right dominant side: Secondary | ICD-10-CM | POA: Diagnosis not present

## 2021-07-02 DIAGNOSIS — N1831 Chronic kidney disease, stage 3a: Secondary | ICD-10-CM | POA: Diagnosis not present

## 2021-07-02 DIAGNOSIS — G8929 Other chronic pain: Secondary | ICD-10-CM | POA: Diagnosis not present

## 2021-07-05 DIAGNOSIS — F331 Major depressive disorder, recurrent, moderate: Secondary | ICD-10-CM | POA: Diagnosis not present

## 2021-07-09 DIAGNOSIS — N1831 Chronic kidney disease, stage 3a: Secondary | ICD-10-CM | POA: Diagnosis not present

## 2021-07-09 DIAGNOSIS — S42201D Unspecified fracture of upper end of right humerus, subsequent encounter for fracture with routine healing: Secondary | ICD-10-CM | POA: Diagnosis not present

## 2021-07-09 DIAGNOSIS — G8929 Other chronic pain: Secondary | ICD-10-CM | POA: Diagnosis not present

## 2021-07-09 DIAGNOSIS — I129 Hypertensive chronic kidney disease with stage 1 through stage 4 chronic kidney disease, or unspecified chronic kidney disease: Secondary | ICD-10-CM | POA: Diagnosis not present

## 2021-07-09 DIAGNOSIS — E113399 Type 2 diabetes mellitus with moderate nonproliferative diabetic retinopathy without macular edema, unspecified eye: Secondary | ICD-10-CM | POA: Diagnosis not present

## 2021-07-09 DIAGNOSIS — D631 Anemia in chronic kidney disease: Secondary | ICD-10-CM | POA: Diagnosis not present

## 2021-07-09 DIAGNOSIS — F0390 Unspecified dementia without behavioral disturbance: Secondary | ICD-10-CM | POA: Diagnosis not present

## 2021-07-09 DIAGNOSIS — I69351 Hemiplegia and hemiparesis following cerebral infarction affecting right dominant side: Secondary | ICD-10-CM | POA: Diagnosis not present

## 2021-07-09 DIAGNOSIS — E1122 Type 2 diabetes mellitus with diabetic chronic kidney disease: Secondary | ICD-10-CM | POA: Diagnosis not present

## 2021-07-10 DIAGNOSIS — F331 Major depressive disorder, recurrent, moderate: Secondary | ICD-10-CM | POA: Diagnosis not present

## 2021-07-10 DIAGNOSIS — F1999 Other psychoactive substance use, unspecified with unspecified psychoactive substance-induced disorder: Secondary | ICD-10-CM | POA: Diagnosis not present

## 2021-07-10 DIAGNOSIS — G3184 Mild cognitive impairment, so stated: Secondary | ICD-10-CM | POA: Diagnosis not present

## 2021-07-12 DIAGNOSIS — F331 Major depressive disorder, recurrent, moderate: Secondary | ICD-10-CM | POA: Diagnosis not present

## 2021-07-14 DIAGNOSIS — F0390 Unspecified dementia without behavioral disturbance: Secondary | ICD-10-CM | POA: Diagnosis not present

## 2021-07-14 DIAGNOSIS — N1831 Chronic kidney disease, stage 3a: Secondary | ICD-10-CM | POA: Diagnosis not present

## 2021-07-14 DIAGNOSIS — E113399 Type 2 diabetes mellitus with moderate nonproliferative diabetic retinopathy without macular edema, unspecified eye: Secondary | ICD-10-CM | POA: Diagnosis not present

## 2021-07-14 DIAGNOSIS — G8929 Other chronic pain: Secondary | ICD-10-CM | POA: Diagnosis not present

## 2021-07-14 DIAGNOSIS — S42201D Unspecified fracture of upper end of right humerus, subsequent encounter for fracture with routine healing: Secondary | ICD-10-CM | POA: Diagnosis not present

## 2021-07-14 DIAGNOSIS — E1122 Type 2 diabetes mellitus with diabetic chronic kidney disease: Secondary | ICD-10-CM | POA: Diagnosis not present

## 2021-07-14 DIAGNOSIS — I69351 Hemiplegia and hemiparesis following cerebral infarction affecting right dominant side: Secondary | ICD-10-CM | POA: Diagnosis not present

## 2021-07-14 DIAGNOSIS — D631 Anemia in chronic kidney disease: Secondary | ICD-10-CM | POA: Diagnosis not present

## 2021-07-14 DIAGNOSIS — I129 Hypertensive chronic kidney disease with stage 1 through stage 4 chronic kidney disease, or unspecified chronic kidney disease: Secondary | ICD-10-CM | POA: Diagnosis not present

## 2021-07-16 DIAGNOSIS — G894 Chronic pain syndrome: Secondary | ICD-10-CM | POA: Diagnosis not present

## 2021-07-19 DIAGNOSIS — F331 Major depressive disorder, recurrent, moderate: Secondary | ICD-10-CM | POA: Diagnosis not present

## 2021-07-21 DIAGNOSIS — I639 Cerebral infarction, unspecified: Secondary | ICD-10-CM | POA: Diagnosis not present

## 2022-05-01 IMAGING — CT CT ABD-PELV W/ CM
2 of 5 series · 15 of 46 positions shown, 17 images · IV contrast (omnipaque)
Comparison: None.

CLINICAL DATA: Acute nonlocalized abdominal pain.

EXAM:
CT ABDOMEN AND PELVIS WITH CONTRAST
TECHNIQUE: Multidetector CT imaging of the abdomen and pelvis was performed
using the standard protocol following bolus administration of
intravenous contrast.
CONTRAST:  100mL OMNIPAQUE IOHEXOL 300 MG/ML  SOLN

[Series 2: axial st · axial · 0.67mm/px · z∈[-446,-16]mm · 12 of 100 slices shown, 14 images]
[im 7/100  soft-tissue]
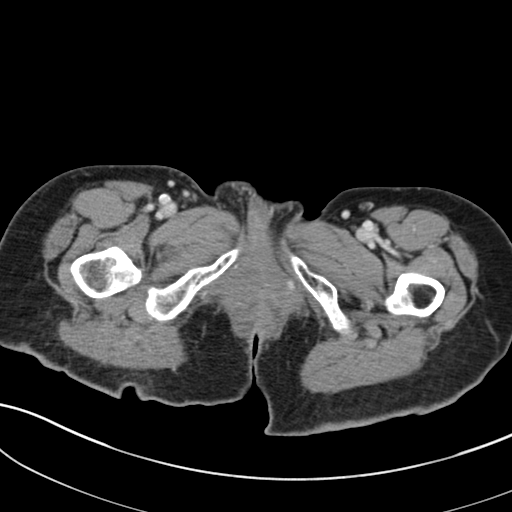
[im 7/100  bone]
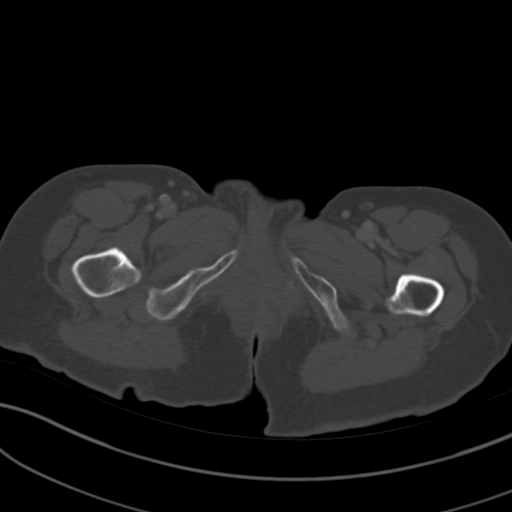
[im 14/100  soft-tissue]
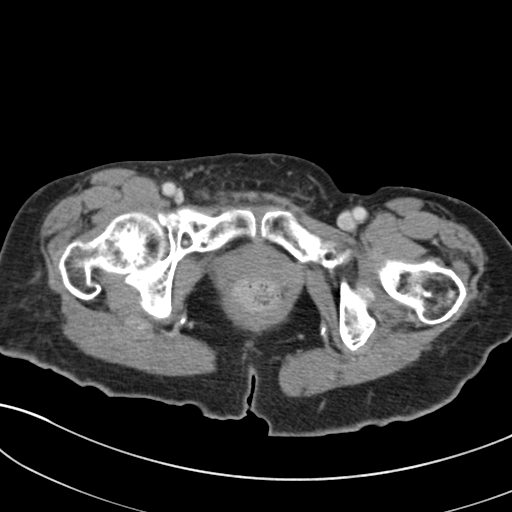
[im 20/100  soft-tissue]
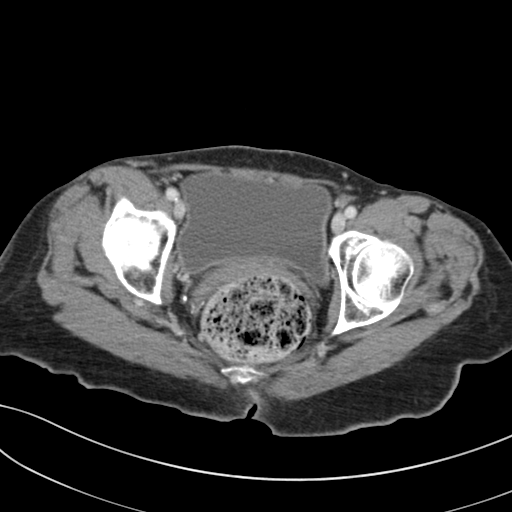
[im 34/100  soft-tissue]
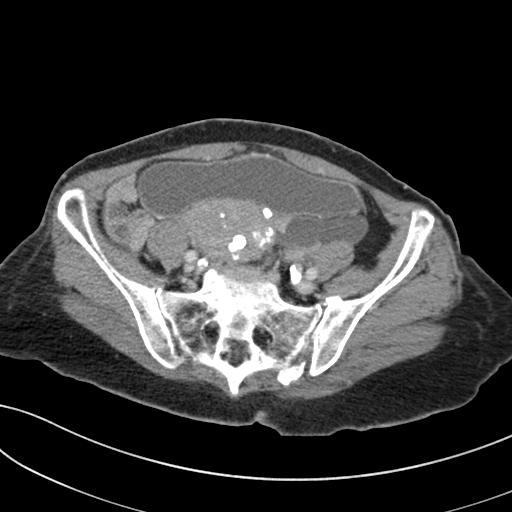
[im 40/100  soft-tissue]
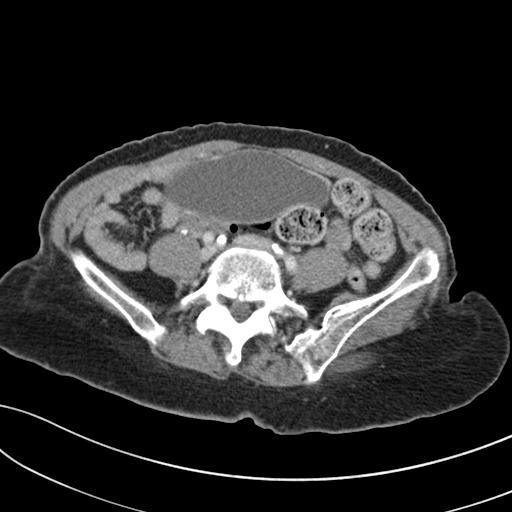
[im 47/100  soft-tissue]
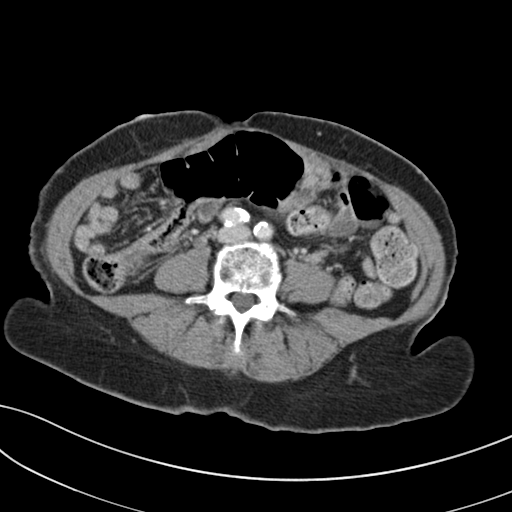
[im 53/100  soft-tissue]
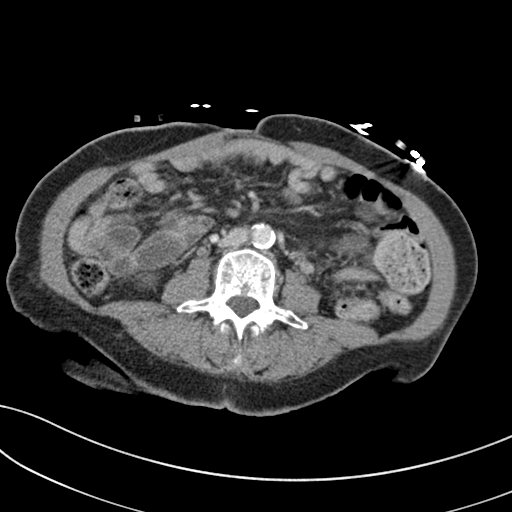
[im 60/100  soft-tissue]
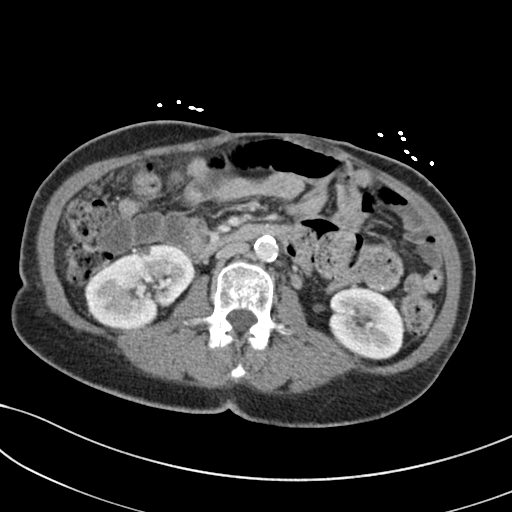
[im 67/100  soft-tissue]
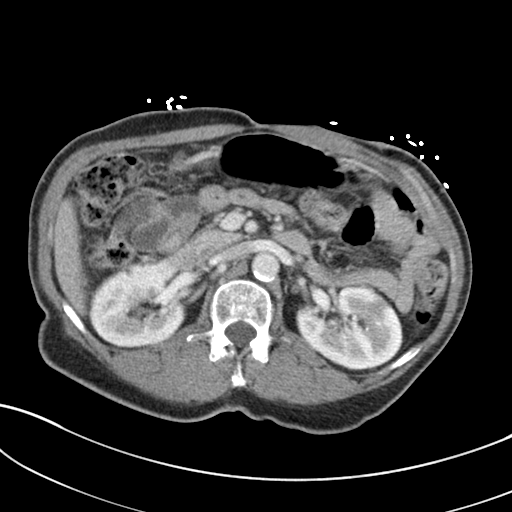
[im 67/100  bone]
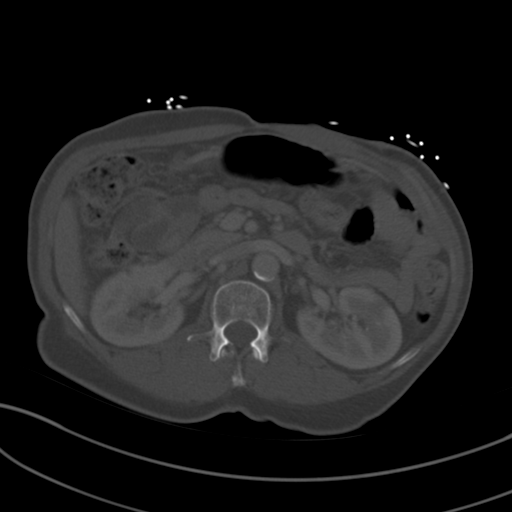
[im 80/100  soft-tissue]
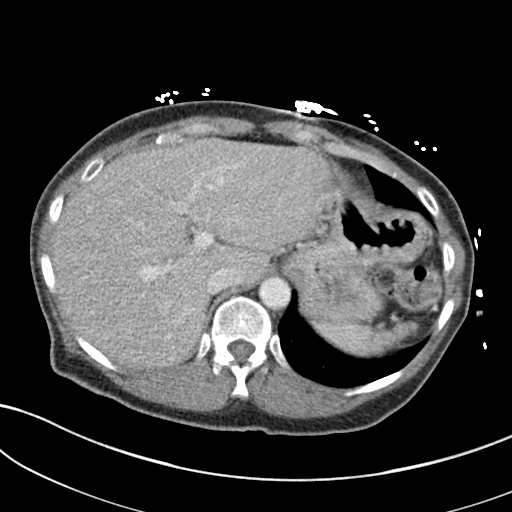
[im 86/100  soft-tissue]
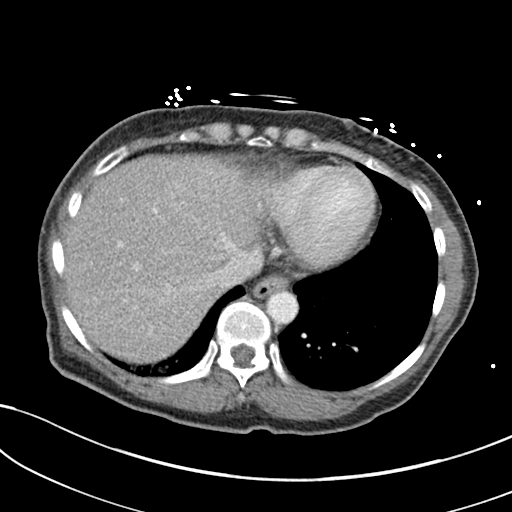
[im 93/100  soft-tissue]
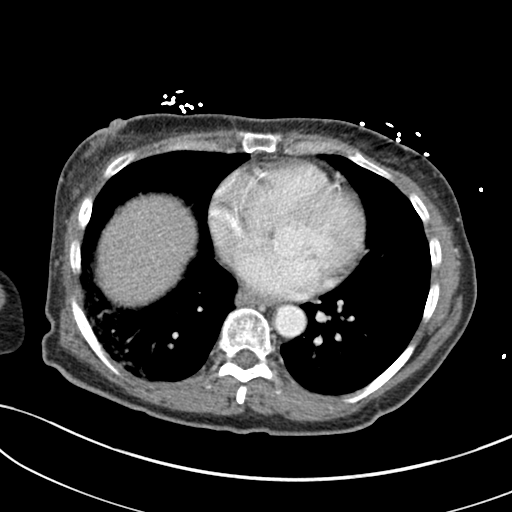

[Series 5: coronal st · coronal · 0.68mm/px · 3 of 117 slices shown]
[im 39/117  soft-tissue]
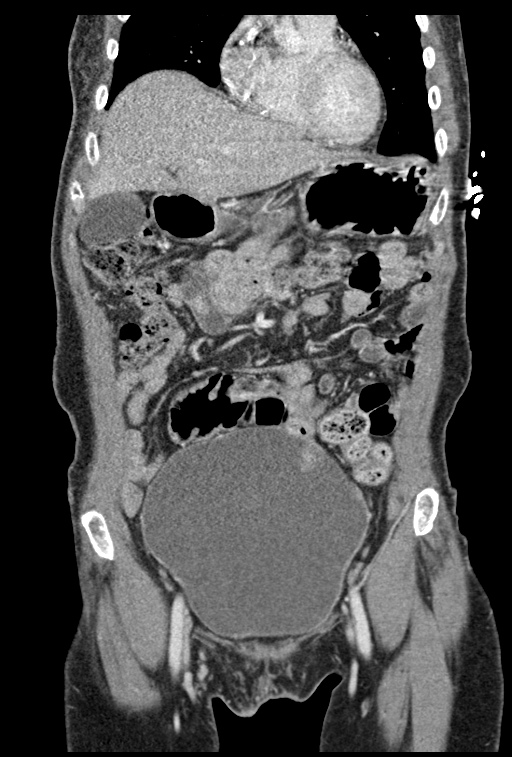
[im 52/117  soft-tissue]
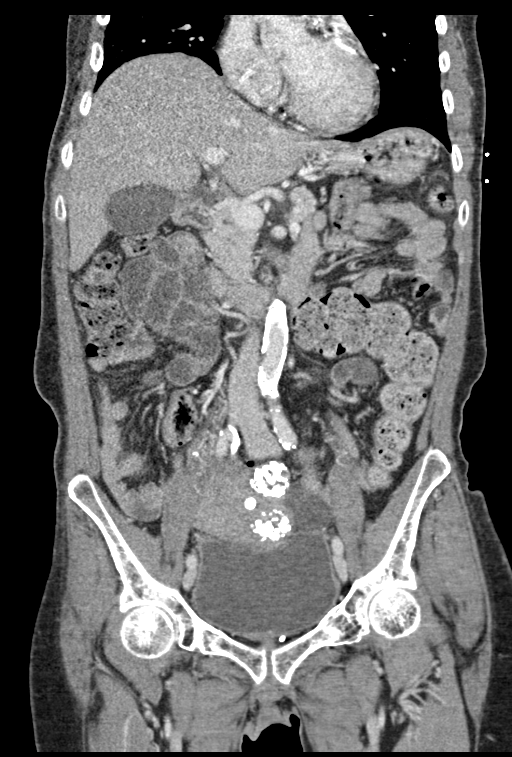
[im 65/117  soft-tissue]
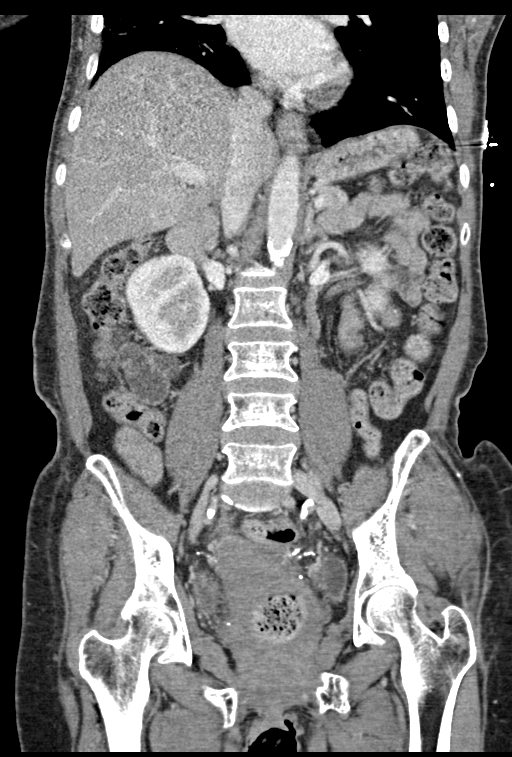

[15 of 46 positions shown; findings below may reference images not displayed]

FINDINGS: Lower chest: There are subpleural ground-glass opacities in the
right lower and right middle lobe. Coronary artery calcifications.
No pleural effusion.

Hepatobiliary: No focal hepatic abnormality. Mild gallbladder
distention without calcified gallstone or pericholecystic
inflammation. There is no biliary dilatation.

Pancreas: No ductal dilatation or inflammation.

Spleen: Normal in size without focal abnormality. 10 mm calcified
splenic artery aneurysm at the hilum.

Adrenals/Urinary Tract: Normal adrenal glands. No hydronephrosis or
perinephric edema. Homogeneous renal enhancement with symmetric
excretion on delayed phase imaging. Small bilateral renal cysts.
Elongated urinary bladder without bladder wall thickening.

Stomach/Bowel: Mild gaseous gastric distension without gastric wall
thickening. Normal positioning of the duodenum and ligament of
[place]. Few fluid-filled loops of small bowel in the lower abdomen
in a nonobstructive pattern. Cecum located in the midline.
Air-filled appendix courses centrally. Moderate volume of colonic
stool. There is stool distending the rectum. Rectal distention of
7.3 cm. No rectal wall thickening or prior rectal inflammation.

Vascular/Lymphatic: Aortic atherosclerosis. No aortic aneurysm. 10
mm calcified splenic artery aneurysm. Patent portal vein. Patent
mesenteric vasculature. No adenopathy.

Reproductive: Calcified uterine fibroids. Tubular fluid density
structure in the left adnexa spanning 5.4 cm may represent
hydrosalpinx but is nonspecific.

Other: No free air, free fluid, or intra-abdominal fluid collection.

Musculoskeletal: Bones diffusely under mineralized. Remote left
pubic rami fracture. Cleft in the superior sacrum. No acute osseous
abnormalities are seen.
IMPRESSION: 1. Tubular fluid density structure in the left adnexa spanning
cm may represent hydrosalpinx but is nonspecific. Consider further
evaluation with pelvic ultrasound.
2. Moderate volume of colonic stool with stool distending the
rectum, suggesting constipation. No bowel obstruction or
inflammation.
3. Subpleural ground-glass opacities in the right lower and right
middle lobe, likely may be atelectasis or infectious/inflammatory.
4. Calcified uterine fibroids.
5. Calcified splenic artery aneurysms measuring 10 mm.

Aortic Atherosclerosis ([id number]).
